# Patient Record
Sex: Male | Born: 1955 | Race: White | Hispanic: No | Smoking: Current every day smoker
Health system: Southern US, Community
[De-identification: ages and names within clinical notes are randomized; demographics above are authoritative.]

## PROBLEM LIST (undated history)

## (undated) DIAGNOSIS — J449 Chronic obstructive pulmonary disease, unspecified: Secondary | ICD-10-CM

## (undated) DIAGNOSIS — I2510 Atherosclerotic heart disease of native coronary artery without angina pectoris: Secondary | ICD-10-CM

## (undated) HISTORY — PX: CORONARY STENT PLACEMENT: SHX1402

---

## 2005-06-08 ENCOUNTER — Emergency Department: Payer: Self-pay | Admitting: Unknown Physician Specialty

## 2015-01-21 ENCOUNTER — Emergency Department: Payer: Self-pay | Admitting: Emergency Medicine

## 2016-02-17 ENCOUNTER — Emergency Department
Admission: EM | Admit: 2016-02-17 | Discharge: 2016-02-17 | Disposition: A | Discharge status: Home / Self Care | Payer: Self-pay | Attending: Emergency Medicine | Admitting: Emergency Medicine

## 2016-02-17 ENCOUNTER — Encounter: Payer: Self-pay | Admitting: Emergency Medicine

## 2016-02-17 ENCOUNTER — Emergency Department: Payer: Self-pay

## 2016-02-17 DIAGNOSIS — H7291 Unspecified perforation of tympanic membrane, right ear: Secondary | ICD-10-CM

## 2016-02-17 DIAGNOSIS — I2510 Atherosclerotic heart disease of native coronary artery without angina pectoris: Secondary | ICD-10-CM | POA: Insufficient documentation

## 2016-02-17 DIAGNOSIS — Z955 Presence of coronary angioplasty implant and graft: Secondary | ICD-10-CM | POA: Insufficient documentation

## 2016-02-17 DIAGNOSIS — H66011 Acute suppurative otitis media with spontaneous rupture of ear drum, right ear: Secondary | ICD-10-CM | POA: Insufficient documentation

## 2016-02-17 DIAGNOSIS — J449 Chronic obstructive pulmonary disease, unspecified: Secondary | ICD-10-CM | POA: Insufficient documentation

## 2016-02-17 DIAGNOSIS — F17200 Nicotine dependence, unspecified, uncomplicated: Secondary | ICD-10-CM | POA: Insufficient documentation

## 2016-02-17 HISTORY — DX: Chronic obstructive pulmonary disease, unspecified: J44.9

## 2016-02-17 HISTORY — DX: Atherosclerotic heart disease of native coronary artery without angina pectoris: I25.10

## 2016-02-17 MED ORDER — OFLOXACIN 0.3 % OP SOLN
5.0000 [drp] | Freq: Every day | OPHTHALMIC | Status: DC
  Administered 2016-02-17: 5 [drp] via OTIC
  Filled 2016-02-17: qty 5

## 2016-02-17 MED ORDER — AMOXICILLIN 500 MG PO CAPS
500.0000 mg | ORAL_CAPSULE | Freq: Once | ORAL | Status: AC
Start: 2016-02-17 — End: 2016-02-17
  Administered 2016-02-17: 500 mg via ORAL
  Filled 2016-02-17: qty 1

## 2016-02-17 MED ORDER — AMOXICILLIN 500 MG PO CAPS: 500.0000 mg | ORAL_CAPSULE | Freq: Three times a day (TID) | ORAL | Status: DC

## 2016-02-17 MED ORDER — ONDANSETRON 8 MG PO TBDP
ORAL_TABLET | ORAL | Status: AC
  Administered 2016-02-17: 10:00:00
  Filled 2016-02-17: qty 1

## 2016-02-17 MED ORDER — OFLOXACIN 0.3 % OP SOLN: 10.0000 [drp] | Freq: Two times a day (BID) | OPHTHALMIC | Status: DC

## 2016-02-17 NOTE — Discharge Instructions (Signed)
You were evaluated for ear infection and found to have perforated ear drum with ear infection.  You are being treated with antibiotic pills called amoxicillin and ear drops called ofloxacin.  Return to the emergency department for any worsening condition including fever, facial pain, confusion altered mental status, headache, or any other symptoms concerning to.  You do need to follow-up with a primary care physician to establish a regular doctor for general health maintenance. The [facility] office numbers provided. He should also follow-up with the ears nose throat specialist, to check on how you're ears healing after your antibiotics.  Dr. Ruben ImJuengal's office number is provided.   Eardrum Perforation The eardrum is a thin, round tissue inside the ear. It allows you to hear. The eardrum can get torn (perforated). Eardrums often heal on their own. There is often little or no long-term hearing loss. HOME CARE  Keep your ear dry while it heals. Do not let your head go under water. Do not swim or dive until your doctor says it is okay.  Before you take a bath or shower, do one of these things to keep water out of your ear:  Put a waterproof earplug in your ear.  Put petroleum jelly all over a cotton ball. Put the cotton ball in your ear.  Take medicines only as told by your doctor.  Avoid blowing your nose if you can. If you blow your nose, do it gently.  Continue your normal activities after your eardrum heals. Your doctor will tell you when your eardrum has healed.  Talk to your doctor before you fly on an airplane.  Keep all doctor follow-up visits as told by your doctor. This is important. GET HELP IF:  You have a fever. GET HELP RIGHT AWAY IF:  You have blood or yellowish-white fluid (pus) coming from your ear.  You feel dizzy or off balance.  You feel sick to your stomach (nauseous), or you throw up (vomit).  You have more pain.   This information is not intended to  replace advice given to you by your health care provider. Make sure you discuss any questions you have with your health care provider.   Document Released: 04/08/2010 Document Revised: 11/09/2014 Document Reviewed: 05/28/2014 Elsevier Interactive Patient Education 2016 Else[employer] Inc.  Otitis Media With Effusion Otitis media with effusion is the presence of fluid in the middle ear. This is a common problem in children, which often follows ear infections. It may be present for weeks or longer after the infection. Unlike an acute ear infection, otitis media with effusion refers only to fluid behind the ear drum and not infection. Children with repeated ear and sinus infections and allergy problems are the most likely to get otitis media with effusion. CAUSES  The most frequent cause of the fluid buildup is dysfunction of the eustachian tubes. These are the tubes that drain fluid in the ears to the back of the nose (nasopharynx). SYMPTOMS   The main symptom of this condition is hearing loss. As a result, you or your child may:  Listen to the TV at a loud volume.  Not respond to questions.  Ask "what" often when spoken to.  Mistake or confuse one sound or word for another.  There may be a sensation of fullness or pressure but usually not pain. DIAGNOSIS   Your health care provider will diagnose this condition by examining you or your child's ears.  Your health care provider may test the pressure in you  or your child's ear with a tympanometer.  A hearing test may be conducted if the problem persists. TREATMENT   Treatment depends on the duration and the effects of the effusion.  Antibiotics, decongestants, nose drops, and cortisone-type drugs (tablets or nasal spray) may not be helpful.  Children with persistent ear effusions may have delayed language or behavioral problems. Children at risk for developmental delays in hearing, learning, and speech may require referral to a specialist  earlier than children not at risk.  You or your child's health care provider may suggest a referral to an ear, nose, and throat surgeon for treatment. The following may help restore normal hearing:  Drainage of fluid.  Placement of ear tubes (tympanostomy tubes).  Removal of adenoids (adenoidectomy). HOME CARE INSTRUCTIONS   Avoid secondhand smoke.  Infants who are breastfed are less likely to have this condition.  Avoid feeding infants while they are lying flat.  Avoid known environmental allergens.  Avoid people who are sick. SEEK MEDICAL CARE IF:   Hearing is not better in 3 months.  Hearing is worse.  Ear pain.  Drainage from the ear.  Dizziness. MAKE SURE YOU:   Understand these instructions.  Will watch your condition.  Will get help right away if you are not doing well or get worse.   This information is not intended to replace advice given to you by your health care provider. Make sure you discuss any questions you have with your health care provider.   Document Released: 11/26/2004 Document Revised: 11/09/2014 Document Reviewed: 05/16/2013 Elsevier Interactive Patient Education [employer]

## 2016-02-17 NOTE — ED Provider Notes (Signed)
[facility] Emergency Department Provider Note   ____________________________________________  Time seen: Approximately 11am I have reviewed the triage vital signs and the triage nursing note.  HISTORY  Chief Complaint Otalgia and Cough   Historian Patient and daughter is in the room  HPI Barry Sampson is a 60 y.o. male who smokes and has COPD and a chronic cough, who does not follow with primary care physician, who is here due to right ear pain for a few days and then this morning his ear started draining. She has a chronic cough, which is really no worse than prior. He has not had a fever. His facial pain or sinus drainage. No headache or confusion altered mental status. Symptoms are moderate. Nothing makes it worse or better    Past Medical History  Diagnosis Date  . COPD (chronic obstructive pulmonary disease) (HCC)   . Coronary artery disease     There are no active problems to display for this patient.   Past Surgical History  Procedure Laterality Date  . Coronary stent placement      Current Outpatient Rx  Name  Route  Sig  Dispense  Refill  . amoxicillin (AMOXIL) 500 MG capsule   Oral   Take 1 capsule (500 mg total) by mouth 3 (three) times daily.   30 capsule   0   . ofloxacin (OCUFLOX) 0.3 % ophthalmic solution   Right Ear   Place 10 drops into the right ear 2 (two) times daily.   14 mL   0     Allergies Review of patient's allergies indicates no known allergies.  No family history on file.  Social History Social History  Substance Use Topics  . Smoking status: Current Every Day Smoker  . Smokeless tobacco: Not on file  . Alcohol Use: No    Review of Systems  Constitutional: Negative for fever. Eyes: Negative for visual changes. ENT: Negative for sore throat. Cardiovascular: Negative for chest pain. Respiratory: Chronic shortness of breath. Gastrointestinal: Negative for abdominal pain, vomiting and  diarrhea. Genitourinary:  Musculoskeletal: Skin: Negative for rash. Neurological: Negative for headache. 10 point Review of Systems otherwise negative ____________________________________________   PHYSICAL EXAM:  VITAL SIGNS: ED Triage Vitals  Enc Vitals Group     BP 02/17/16 0922 155/88 mmHg     Pulse Rate 02/17/16 0922 78     Resp --      Temp 02/17/16 0922 98 F (36.7 C)     Temp Source 02/17/16 0922 Oral     SpO2 02/17/16 0922 95 %     Weight 02/17/16 0922 185 lb (83.915 kg)     Height 02/17/16 0922  (1.702 m)     Head Cir --      Peak Flow --      Pain Score 02/17/16 0925 8     Pain Loc --      Pain Edu? --      Excl. in GC? --      Constitutional: Alert and oriented. Well appearing and in no distress.Somewhat unkept. HEENT   Head: Normocephalic and atraumatic.      Eyes: Conjunctivae are normal. PERRL. Normal extraocular movements.      Ears:   Right TM is dull with fluid behind the eardrum. There is a TM perforation visible.      Nose: No congestion/rhinnorhea.   Mouth/Throat: Mucous membranes are moist. Multiple missing teeth.   Neck: No stridor. Cardiovascular/Chest: Normal rate, regular rhythm.  No murmurs, rubs, or gallops. Respiratory: Normal respiratory effort without tachypnea nor retractions. Breath sounds are clear and equal bilaterally. No wheezes/rales/rhonchi. Gastrointestinal: Nontender Genitourinary/rectal:Deferred Musculoskeletal: Nontender with normal range of motion in all extremities. No joint effusions.  No lower extremity tenderness.  No edema. Neurologic:  Normal speech and language. No gross or focal neurologic deficits are appreciated. Skin:  Skin is warm, dry and intact. No rash noted. Psychiatric: Mood and affect are normal. Speech and behavior are normal. Patient exhibits appropriate insight and judgment.  ____________________________________________   EKG I, Governor Rooksebecca Webber Michiels, MD, the attending physician have personally  viewed and interpreted all ECGs.  None ____________________________________________  LABS (pertinent positives/negatives)  None  ____________________________________________  RADIOLOGY All Xrays were viewed by me. Imaging interpreted by Radiologist.  Chest two-view: No active cardiopulmonary disease. __________________________________________  PROCEDURES  Procedure(s) performed: None  Critical Care performed: None  ____________________________________________   ED COURSE / ASSESSMENT AND PLAN  Pertinent labs & imaging results that were available during my care of the patient were reviewed by me and considered in my medical decision making (see chart for details).   In terms of his right otalgia with drainage, he has evidence of otitis media with perforation. He will be treated with ofloxacin drops and amoxicillin by mouth.  Due to shortage of otic preparation, ophthalmic preparation will be substituted.  No evidence of systemic illness.  Chest x-ray was performed by protocol for complaint of cough, but in discussing with him, it sounds like this is a chronic cough. His chest x-ray is clear.    CONSULTATIONS:   None   Patient / Family / Caregiver informed of clinical course, medical decision-making process, and agree with plan.   I discussed return precautions, follow-up instructions, and discharged instructions with patient and/or family.   ___________________________________________   FINAL CLINICAL IMPRESSION(S) / ED DIAGNOSES   Final diagnoses:  Tympanic membrane perforation, right  Acute suppurative otitis media of right ear with spontaneous rupture of tympanic membrane, recurrence not specified              Note: This dictation was prepared with Dragon dictation. Any transcriptional errors that result from this process are unintentional   Governor Rooksebecca Lavaughn Bisig, MD 02/17/16 1154

## 2016-02-17 NOTE — ED Notes (Signed)
Pt with productive cough, sinus drainage, right ear pain with bloody drainage for 3 days.

## 2016-02-17 NOTE — ED Notes (Signed)
Pt vomited in waiting room.  zofran given.  Skin w/d with good color at this time.

## 2016-02-24 ENCOUNTER — Encounter: Payer: Self-pay | Admitting: Emergency Medicine

## 2016-02-24 ENCOUNTER — Emergency Department
Admission: EM | Admit: 2016-02-24 | Discharge: 2016-02-24 | Disposition: A | Discharge status: Home / Self Care | Payer: Self-pay | Attending: Emergency Medicine | Admitting: Emergency Medicine

## 2016-02-24 ENCOUNTER — Other Ambulatory Visit: Payer: Self-pay | Admitting: Otolaryngology

## 2016-02-24 DIAGNOSIS — F1290 Cannabis use, unspecified, uncomplicated: Secondary | ICD-10-CM | POA: Insufficient documentation

## 2016-02-24 DIAGNOSIS — Z95818 Presence of other cardiac implants and grafts: Secondary | ICD-10-CM | POA: Insufficient documentation

## 2016-02-24 DIAGNOSIS — F17210 Nicotine dependence, cigarettes, uncomplicated: Secondary | ICD-10-CM | POA: Insufficient documentation

## 2016-02-24 DIAGNOSIS — G500 Trigeminal neuralgia: Secondary | ICD-10-CM

## 2016-02-24 DIAGNOSIS — H6691 Otitis media, unspecified, right ear: Secondary | ICD-10-CM | POA: Insufficient documentation

## 2016-02-24 DIAGNOSIS — I2510 Atherosclerotic heart disease of native coronary artery without angina pectoris: Secondary | ICD-10-CM | POA: Insufficient documentation

## 2016-02-24 DIAGNOSIS — J449 Chronic obstructive pulmonary disease, unspecified: Secondary | ICD-10-CM | POA: Insufficient documentation

## 2016-02-24 DIAGNOSIS — Z09 Encounter for follow-up examination after completed treatment for conditions other than malignant neoplasm: Secondary | ICD-10-CM | POA: Insufficient documentation

## 2016-02-24 MED ORDER — HYDROCODONE-ACETAMINOPHEN 5-325 MG PO TABS: 1.0000 | ORAL_TABLET | ORAL | Status: DC | PRN

## 2016-02-24 MED ORDER — OXYCODONE-ACETAMINOPHEN 5-325 MG PO TABS
2.0000 | ORAL_TABLET | Freq: Once | ORAL | Status: AC
  Administered 2016-02-24: 2 via ORAL
  Filled 2016-02-24: qty 2

## 2016-02-24 NOTE — ED Notes (Signed)
Pt c/o right earache since 02/14/16; seen here last Sunday and put on antibiotics and drops; pt says he's almost done with the 10 days of antibiotics and not feeling any better; pt says he feels like his ear is clogged up

## 2016-02-24 NOTE — ED Provider Notes (Addendum)
[facility] Emergency Department Provider Note  ____________________________________________  Time seen: Approximately 1:18 AM  I have reviewed the triage vital signs and the nursing notes.   HISTORY  Chief Complaint Otalgia    HPI Barry Sampson is a 60 y.o. male presents for re-evaluation of right ear pain.  Seen in this ED about 1 week ago, diagnosed with otitis media with perforation in the right ear.  He was given the usual precautions and antibiotic drops as well as oral antibiotics but he states he is feeling no better.  He describes the symptoms as severe, sharp and a feeling of "fullness", and nothing makes it better or worse.  He denies fever/chills, chest pain, shortness of breath, nausea, vomiting, diarrhea, dizziness.  Of note, he states that in an attempt to make it feel better, he has been putting hydrogen peroxide and sweet oil in his ear as well as the antibiotic drops.  Out of desperation he also stuck a vacuum cleaner into his right ear to try to remove "a bad stuff".   Past Medical History  Diagnosis Date  . COPD (chronic obstructive pulmonary disease) (HCC)   . Coronary artery disease     There are no active problems to display for this patient.   Past Surgical History  Procedure Laterality Date  . Coronary stent placement      Current Outpatient Rx  Name  Route  Sig  Dispense  Refill  . amoxicillin (AMOXIL) 500 MG capsule   Oral   Take 1 capsule (500 mg total) by mouth 3 (three) times daily.   30 capsule   0   . HYDROcodone-acetaminophen (NORCO/VICODIN) 5-325 MG tablet   Oral   Take 1-2 tablets by mouth every 4 (four) hours as needed for moderate pain.   10 tablet   0   . ofloxacin (OCUFLOX) 0.3 % ophthalmic solution   Right Ear   Place 10 drops into the right ear 2 (two) times daily.   14 mL   0     Allergies Review of patient's allergies indicates no known allergies.  History reviewed. No pertinent family  history.  Social History Social History  Substance Use Topics  . Smoking status: Current Every Day Smoker -- 1.00 packs/day    Types: Cigarettes  . Smokeless tobacco: None  . Alcohol Use: No    Review of Systems Constitutional: No fever/chills ENT: No sore throat.  Pain in right ear Cardiovascular: Denies chest pain. Respiratory: Denies shortness of breath. Gastrointestinal: No abdominal pain.  No nausea, no vomiting.  No diarrhea.  No constipation. Genitourinary: Negative for dysuria. Musculoskeletal: Negative for back pain. Skin: Negative for rash. Neurological: Negative for headaches, focal weakness or numbness.  10-point ROS otherwise negative.  ____________________________________________   PHYSICAL EXAM:  VITAL SIGNS: ED Triage Vitals  Enc Vitals Group     BP 02/24/16 0028 164/97 mmHg     Pulse Rate 02/24/16 0028 85     Resp 02/24/16 0028 20     Temp 02/24/16 0028 98.2 F (36.8 C)     Temp Source 02/24/16 0028 Oral     SpO2 02/24/16 0028 96 %     Weight 02/24/16 0028 185 lb (83.915 kg)     Height 02/24/16 0028 5\' 7"  (1.702 m)     Head Cir --      Peak Flow --      Pain Score 02/24/16 0036 5     Pain Loc --  Pain Edu? --      Excl. in GC? --     Constitutional: Alert and oriented. Disheveled at baseline, appears uncomfortable Eyes: Conjunctivae are normal. PERRL. EOMI. Head: Atraumatic. Ears:  Healthy appearing left ear canals and TM.  Right ear canal is erythematous and inflamed with an abnormal-appearing tympanic membrane.  I do not specifically see a perforation at this time but it is inflamed, white, and peers infected. Nose: No congestion/rhinnorhea. Cardiovascular: Normal rate, regular rhythm. Good peripheral circulation.  Respiratory: Normal respiratory effort.  No retractions.  Gastrointestinal: Soft and nontender. No distention.  Musculoskeletal: No lower extremity tenderness nor edema. No gross deformities of extremities. Neurologic:   Normal speech and language. No gross focal neurologic deficits are appreciated.  Skin:  Skin is warm, dry and intact. No rash noted.  ____________________________________________   LABS (all labs ordered are listed, but only abnormal results are displayed)  Labs Reviewed - No data to display ____________________________________________  EKG  None ____________________________________________  RADIOLOGY   No results found.  ____________________________________________   PROCEDURES  Procedure(s) performed: None  Critical Care performed: No ____________________________________________   INITIAL IMPRESSION / ASSESSMENT AND PLAN / ED COURSE  Pertinent labs & imaging results that were available during my care of the patient were reviewed by me and considered in my medical decision making (see chart for details).  I counseled the patient that he should not under any circumstances put anything into the affected ear except for the antibiotic drops, including water.  I explained that he must see a specialist and that preferably should happen today.  He still has antibiotic pills and drops and I encouraged him to continue using it.  I gave him a dose of Percocet 2 in the emergency department and a prescription for a few Norco tablets.  I sent Dr. Andee Poles with ENT in a personal message as well as a message through [facility] to let him know about the patient and asked for an appointment in the ENT clinic later today.  The patient knows to call first thing in the morning and how important it is to follow-up.  He understands and agrees with this plan and the patient's wife said she will make sure he follows up and stops putting things in his ear.  ____________________________________________  FINAL CLINICAL IMPRESSION(S) / ED DIAGNOSES  Final diagnoses:  Otitis media of right ear follow-up, not resolved      NEW MEDICATIONS STARTED DURING THIS VISIT:  New Prescriptions    HYDROCODONE-ACETAMINOPHEN (NORCO/VICODIN) 5-325 MG TABLET    Take 1-2 tablets by mouth every 4 (four) hours as needed for moderate pain.      Note:  This document was prepared using Dragon voice recognition software and may include unintentional dictation errors.   Loleta Rose, MD 02/24/16 7829  Loleta Rose, MD 02/24/16 5621

## 2016-02-24 NOTE — Discharge Instructions (Signed)
Please continue to use your antibiotic drops and oral medication.  DO NOT PUT ANYTHING INTO YOUR EAR EXCEPT FOR THE DROPS, NOT EVEN WATER.  Follow up later today in the ENT clinic.    Otitis Media, Adult Otitis media is redness, soreness, and puffiness (swelling) in the space just behind your eardrum (middle ear). It may be caused by allergies or infection. It often happens along with a cold. HOME CARE  Take your medicine as told. Finish it even if you start to feel better.  Only take over-the-counter or prescription medicines for pain, discomfort, or fever as told by your doctor.  Follow up with your doctor as told. GET HELP IF:  You have otitis media only in one ear, or bleeding from your nose, or both.  You notice a lump on your neck.  You are not getting better in 3-5 days.  You feel worse instead of better. GET HELP RIGHT AWAY IF:   You have pain that is not helped with medicine.  You have puffiness, redness, or pain around your ear.  You get a stiff neck.  You cannot move part of your face (paralysis).  You notice that the bone behind your ear hurts when you touch it. MAKE SURE YOU:   Understand these instructions.  Will watch your condition.  Will get help right away if you are not doing well or get worse.   This information is not intended to replace advice given to you by your health care provider. Make sure you discuss any questions you have with your health care provider.   Document Released: 04/06/2008 Document Revised: 11/09/2014 Document Reviewed: 05/16/2013 Elsevier Interactive Patient Education 2016 Elsevier Inc.  Ear Drops, Adult You need to put eardrops in your ear. HOME CARE   Put drops in your affected ear as told.  After putting in the drops, lie down with the ear you put the drops in facing up. Stay this way for 10 minutes. Use the ear drops as long as your doctor tells you.  Before you get up, put a cotton ball gently in your ear. Do not  push it far in your ear.  Do not wash out your ears unless your doctor says it is okay.  Finish all medicines as told by your doctor. You may be told to keep using the eardrops even if you start to feel better.  See your doctor as told for follow-up visits. GET HELP IF:  You have pain that gets worse.  Any unusual fluid (drainage) is coming from your ear (especially if the fluid stinks).  You have trouble hearing.  You get really dizzy as if the room is spinning and feel sick to your stomach (vertigo).  The outside of your ear becomes red or puffy or both. This may be a sign of an allergic reaction. MAKE SURE YOU:   Understand these instructions.  Will watch your condition.  Will get help right away if you are not doing well or get worse.   This information is not intended to replace advice given to you by your health care provider. Make sure you discuss any questions you have with your health care provider.   Document Released: 04/08/2010 Document Revised: 11/09/2014 Document Reviewed: 05/16/2013 Elsevier Interactive Patient Education [employer]

## 2016-02-24 NOTE — ED Notes (Signed)
Pt understands he needs to call ENT MD's office this am before 8am to schedule his appt today; pt shown number on discharge papers and says he will definitely followup; understands he is to put nothing into his ears except the medication ordered during his last visit;

## 2016-03-11 ENCOUNTER — Ambulatory Visit: Payer: Self-pay

## 2016-03-25 ENCOUNTER — Ambulatory Visit: Payer: Self-pay

## 2016-10-14 IMAGING — CR DG CHEST 2V
1 series · 2 of 2 positions shown · non-contrast
Comparison: None.

CLINICAL DATA: Productive cough, sinus discharge

EXAM:
CHEST  2 VIEW

[Series 1: dg chest 2 view · 0.14mm/px · 2 of 2 slices shown]
[im 1/2]
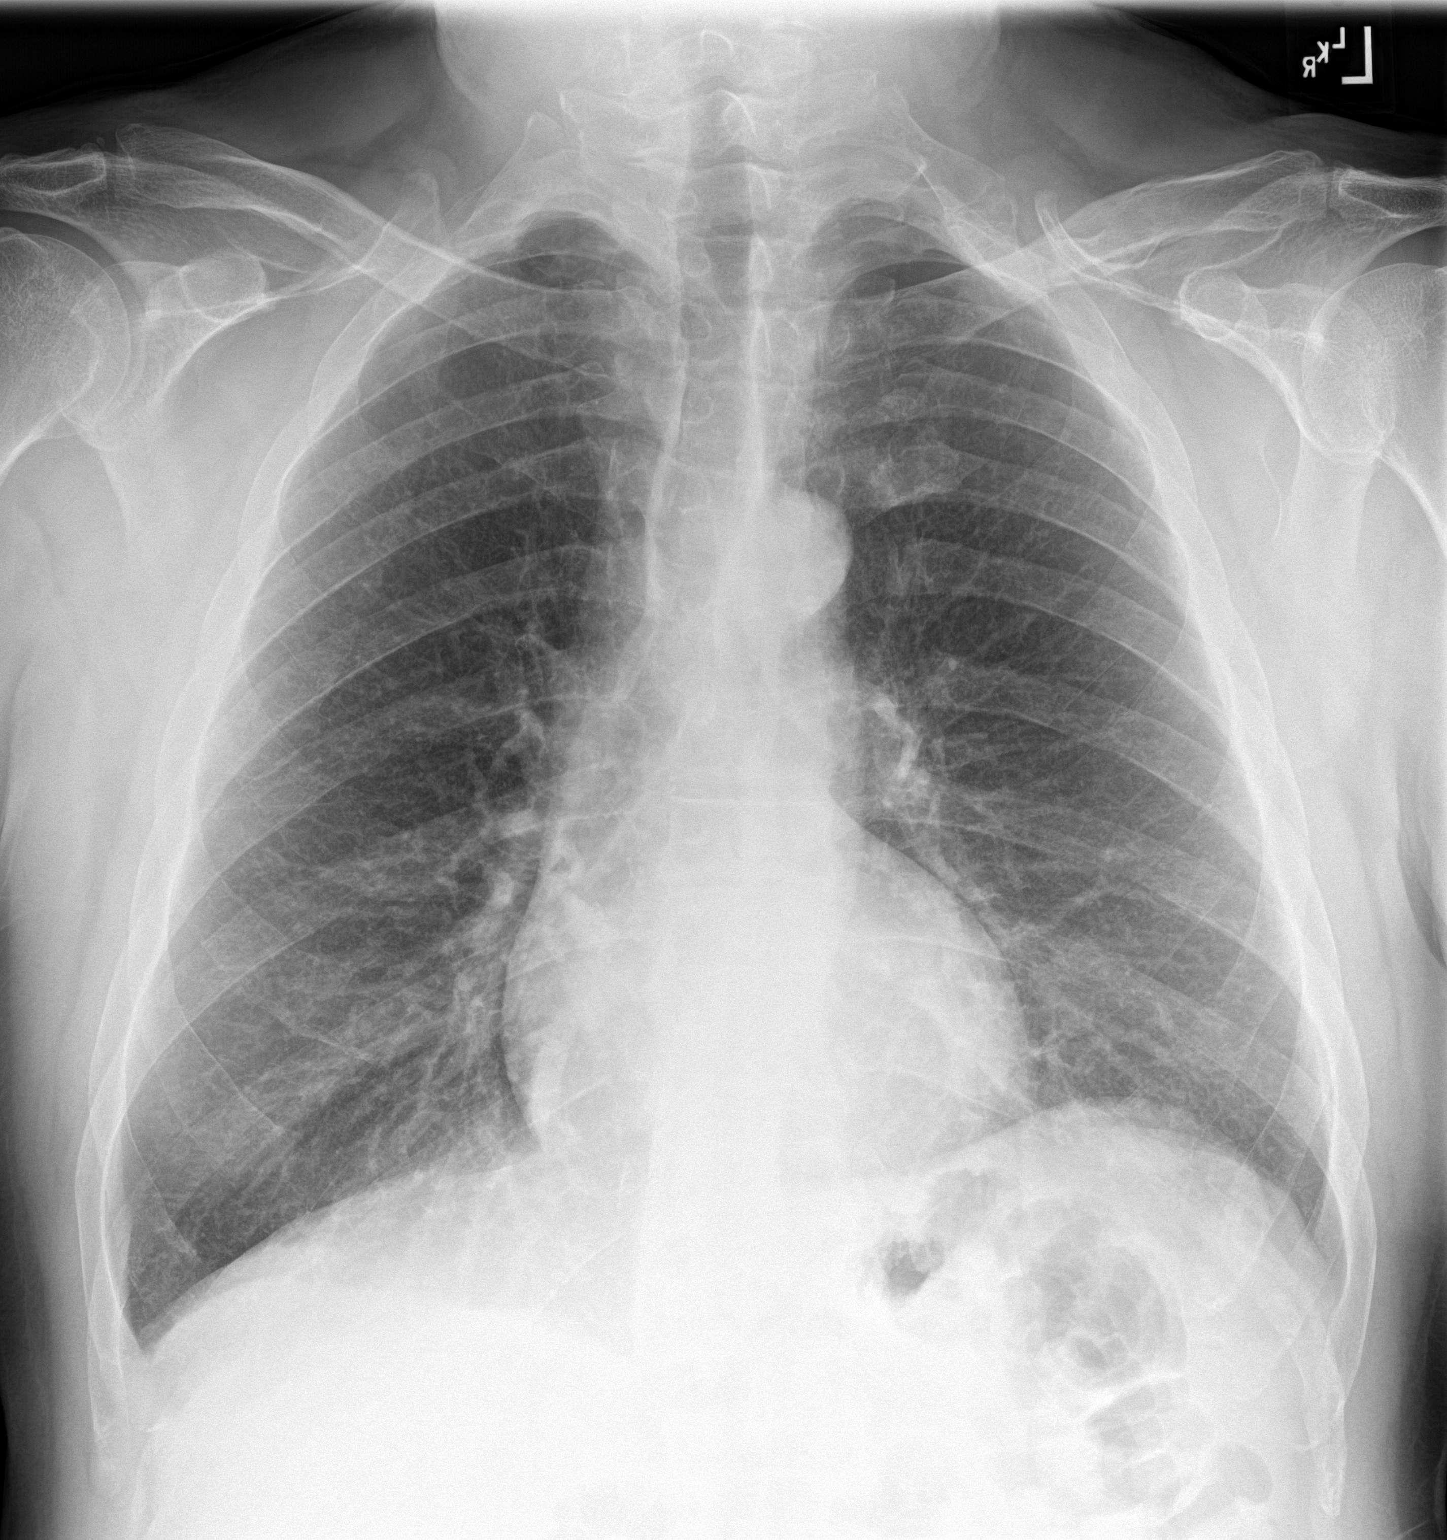
[im 2/2]
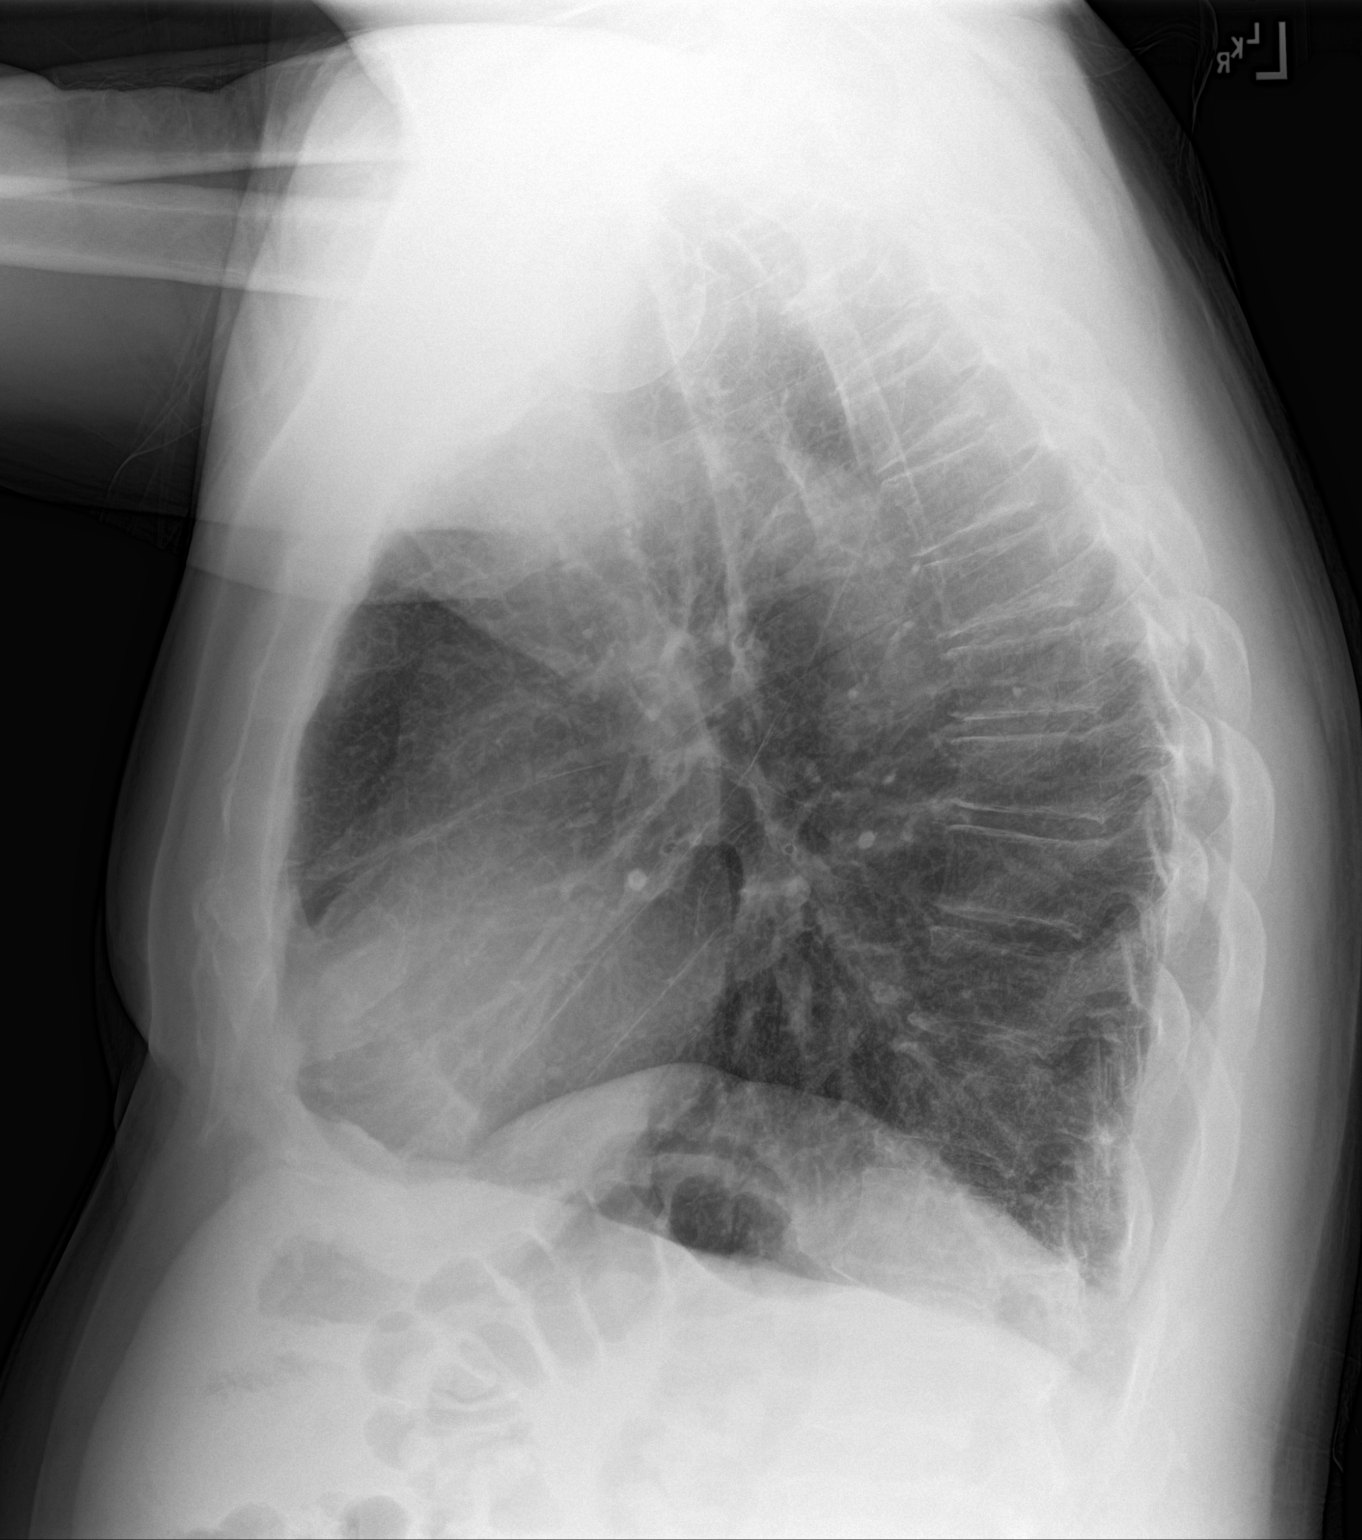

[2 of 2 positions shown; findings below may reference images not displayed]

FINDINGS: The heart size and mediastinal contours are within normal limits.
Both lungs are clear. The visualized skeletal structures are
unremarkable.
IMPRESSION: No active cardiopulmonary disease.

## 2024-04-06 ENCOUNTER — Other Ambulatory Visit: Payer: Self-pay

## 2024-04-06 ENCOUNTER — Emergency Department: Payer: Self-pay

## 2024-04-06 ENCOUNTER — Inpatient Hospital Stay
Admission: EM | Admit: 2024-04-06 | Discharge: 2024-04-15 | DRG: 092 | Disposition: A | Discharge status: Home Health | Payer: Self-pay | Attending: Internal Medicine | Admitting: Internal Medicine

## 2024-04-06 DIAGNOSIS — G9341 Metabolic encephalopathy: Secondary | ICD-10-CM | POA: Diagnosis present

## 2024-04-06 DIAGNOSIS — Z79899 Other long term (current) drug therapy: Secondary | ICD-10-CM

## 2024-04-06 DIAGNOSIS — F17200 Nicotine dependence, unspecified, uncomplicated: Secondary | ICD-10-CM | POA: Insufficient documentation

## 2024-04-06 DIAGNOSIS — R4182 Altered mental status, unspecified: Principal | ICD-10-CM

## 2024-04-06 DIAGNOSIS — F17210 Nicotine dependence, cigarettes, uncomplicated: Secondary | ICD-10-CM | POA: Diagnosis present

## 2024-04-06 DIAGNOSIS — J449 Chronic obstructive pulmonary disease, unspecified: Secondary | ICD-10-CM | POA: Diagnosis present

## 2024-04-06 DIAGNOSIS — Z8673 Personal history of transient ischemic attack (TIA), and cerebral infarction without residual deficits: Secondary | ICD-10-CM

## 2024-04-06 DIAGNOSIS — Z8679 Personal history of other diseases of the circulatory system: Secondary | ICD-10-CM

## 2024-04-06 DIAGNOSIS — F1410 Cocaine abuse, uncomplicated: Secondary | ICD-10-CM | POA: Diagnosis present

## 2024-04-06 DIAGNOSIS — W19XXXA Unspecified fall, initial encounter: Secondary | ICD-10-CM | POA: Diagnosis present

## 2024-04-06 DIAGNOSIS — F1490 Cocaine use, unspecified, uncomplicated: Secondary | ICD-10-CM

## 2024-04-06 DIAGNOSIS — I5022 Chronic systolic (congestive) heart failure: Secondary | ICD-10-CM | POA: Diagnosis present

## 2024-04-06 DIAGNOSIS — F1210 Cannabis abuse, uncomplicated: Secondary | ICD-10-CM | POA: Diagnosis present

## 2024-04-06 DIAGNOSIS — I447 Left bundle-branch block, unspecified: Secondary | ICD-10-CM | POA: Diagnosis present

## 2024-04-06 DIAGNOSIS — G928 Other toxic encephalopathy: Principal | ICD-10-CM | POA: Diagnosis present

## 2024-04-06 DIAGNOSIS — I2510 Atherosclerotic heart disease of native coronary artery without angina pectoris: Secondary | ICD-10-CM | POA: Diagnosis present

## 2024-04-06 DIAGNOSIS — E785 Hyperlipidemia, unspecified: Secondary | ICD-10-CM | POA: Diagnosis present

## 2024-04-06 DIAGNOSIS — R0902 Hypoxemia: Secondary | ICD-10-CM | POA: Diagnosis present

## 2024-04-06 DIAGNOSIS — I110 Hypertensive heart disease with heart failure: Secondary | ICD-10-CM | POA: Diagnosis present

## 2024-04-06 DIAGNOSIS — I255 Ischemic cardiomyopathy: Secondary | ICD-10-CM | POA: Diagnosis present

## 2024-04-06 DIAGNOSIS — F1290 Cannabis use, unspecified, uncomplicated: Secondary | ICD-10-CM

## 2024-04-06 DIAGNOSIS — I10 Essential (primary) hypertension: Secondary | ICD-10-CM | POA: Insufficient documentation

## 2024-04-06 DIAGNOSIS — G319 Degenerative disease of nervous system, unspecified: Secondary | ICD-10-CM | POA: Diagnosis present

## 2024-04-06 DIAGNOSIS — Z955 Presence of coronary angioplasty implant and graft: Secondary | ICD-10-CM

## 2024-04-06 LAB — URINE DRUG SCREEN, QUALITATIVE (ARMC ONLY)
Amphetamines, Ur Screen: NOT DETECTED
Barbiturates, Ur Screen: NOT DETECTED
Benzodiazepine, Ur Scrn: NOT DETECTED
Cannabinoid 50 Ng, Ur ~~LOC~~: POSITIVE — AB
Cocaine Metabolite,Ur ~~LOC~~: POSITIVE — AB
MDMA (Ecstasy)Ur Screen: NOT DETECTED
Methadone Scn, Ur: NOT DETECTED
Opiate, Ur Screen: NOT DETECTED
Phencyclidine (PCP) Ur S: NOT DETECTED
Tricyclic, Ur Screen: NOT DETECTED

## 2024-04-06 LAB — CBC WITH DIFFERENTIAL/PLATELET
Abs Immature Granulocytes: 0.03 10*3/uL (ref 0.00–0.07)
Basophils Absolute: 0.1 10*3/uL (ref 0.0–0.1)
Basophils Relative: 1 %
Eosinophils Absolute: 0.2 10*3/uL (ref 0.0–0.5)
Eosinophils Relative: 3 %
HCT: 44.3 % (ref 39.0–52.0)
Hemoglobin: 14.9 g/dL (ref 13.0–17.0)
Immature Granulocytes: 0 %
Lymphocytes Relative: 40 %
Lymphs Abs: 3 10*3/uL (ref 0.7–4.0)
MCH: 32.4 pg (ref 26.0–34.0)
MCHC: 33.6 g/dL (ref 30.0–36.0)
MCV: 96.3 fL (ref 80.0–100.0)
Monocytes Absolute: 0.6 10*3/uL (ref 0.1–1.0)
Monocytes Relative: 7 %
Neutro Abs: 3.7 10*3/uL (ref 1.7–7.7)
Neutrophils Relative %: 49 %
Platelets: 188 10*3/uL (ref 150–400)
RBC: 4.6 MIL/uL (ref 4.22–5.81)
RDW: 13.2 % (ref 11.5–15.5)
WBC: 7.5 10*3/uL (ref 4.0–10.5)
nRBC: 0 % (ref 0.0–0.2)

## 2024-04-06 LAB — URINALYSIS, W/ REFLEX TO CULTURE (INFECTION SUSPECTED)
Bilirubin Urine: NEGATIVE
Glucose, UA: NEGATIVE mg/dL
Hgb urine dipstick: NEGATIVE
Ketones, ur: NEGATIVE mg/dL
Leukocytes,Ua: NEGATIVE
Nitrite: NEGATIVE
Protein, ur: NEGATIVE mg/dL
Specific Gravity, Urine: 1.026 (ref 1.005–1.030)
Squamous Epithelial / HPF: 0 /HPF (ref 0–5)
pH: 6 (ref 5.0–8.0)

## 2024-04-06 LAB — BASIC METABOLIC PANEL WITH GFR
Anion gap: 9 (ref 5–15)
BUN: 14 mg/dL (ref 8–23)
CO2: 25 mmol/L (ref 22–32)
Calcium: 9 mg/dL (ref 8.9–10.3)
Chloride: 104 mmol/L (ref 98–111)
Creatinine, Ser: 1.07 mg/dL (ref 0.61–1.24)
GFR, Estimated: >60 mL/min (ref >60)
Glucose, Bld: 100 mg/dL — ABNORMAL HIGH (ref 70–99)
Potassium: 3.8 mmol/L (ref 3.5–5.1)
Sodium: 138 mmol/L (ref 135–145)

## 2024-04-06 LAB — TROPONIN I (HIGH SENSITIVITY): Troponin I (High Sensitivity): 14 ng/L

## 2024-04-06 LAB — HEPATIC FUNCTION PANEL
ALT: 36 U/L (ref 0–44)
AST: 35 U/L (ref 15–41)
Albumin: 4 g/dL (ref 3.5–5.0)
Alkaline Phosphatase: 41 U/L (ref 38–126)
Bilirubin, Direct: <0.1 mg/dL (ref 0.0–0.2)
Total Bilirubin: 0.8 mg/dL (ref 0.0–1.2)
Total Protein: 7.9 g/dL (ref 6.5–8.1)

## 2024-04-06 LAB — AMMONIA: Ammonia: 20 umol/L (ref 9–35)

## 2024-04-06 LAB — ETHANOL: Alcohol, Ethyl (B): <15 mg/dL (ref <15)

## 2024-04-06 NOTE — ED Triage Notes (Addendum)
 Pt to ED via [place] [facility] [place], pt found wandering around after driving for the past 4 hours. Pt able to give name and that he is at the ER. Pt reports some dysuria, denies falling and hitting head that he can remember. Speech clear pt moving all extremities freely. Pupils WNL. Pt reports hx stroke, daughter states pt has been missing since yesterday

## 2024-04-06 NOTE — ED Provider Notes (Addendum)
 Mardene Shake Provider Note    Event Date/Time   First MD Initiated Contact with Patient 04/06/24 1930     (approximate)   History   Altered Mental Status   HPI  Barry Sampson is a 68 y.o. male with history of CAD, COPD, presenting with altered mental status.  Per EMS patient was found at a local gas station, was wandering around for the past 4 hours, was able to give him his name but not able to give any other information.  He denies any falls or trauma, did report some dysuria.  Per children, patient does not have any history of dementia, had prior history of stroke without known residual deficits, not on any blood thinners.  No history of alcohol or drug use, no history of liver issues.  States that patient lives at home and takes care of his ADLs.  States that they last saw him yesterday.  Patient has no complaints at this time.  No prior history of psychiatric issues.  Independent history obtained from EMS as well as patient's children above.  On independent chart review of meds, patient had Vicodin listed on his med list but children states that they do not think that he is taking the Vicodin anymore.     Physical Exam   Triage Vital Signs: ED Triage Vitals  Encounter Vitals Group     BP 04/06/24 1920 (!) 166/92     Systolic BP Percentile --      Diastolic BP Percentile --      Pulse Rate 04/06/24 1920 86     Resp 04/06/24 1920 18     Temp 04/06/24 1920 98.4 F (36.9 C)     Temp Source 04/06/24 1920 Oral     SpO2 04/06/24 1912 96 %     Weight 04/06/24 1921 195 lb (88.5 kg)     Height 04/06/24 1921 5\' 10"  (1.778 m)     Head Circumference --      Peak Flow --      Pain Score 04/06/24 1926 0     Pain Loc --      Pain Education --      Exclude from Growth Chart --     Most recent vital signs: Vitals:   04/06/24 1920 04/06/24 2000  BP: (!) 166/92 (!) 164/93  Pulse: 86 79  Resp: 18 18  Temp: 98.4 F (36.9 C)   SpO2: 98% 97%      General: Awake, no distress.  CV:  Good peripheral perfusion.  Resp:  Normal effort.  No thoracic cage tenderness, no respiratory distress or tachypnea Abd:  No distention.  Soft nontender Other:  Pulls are equal and reactive, extraocular movements are intact, no palpable skull deformities or tenderness, no facial deformities or tenderness, no midline spinal tenderness, no CVA tenderness, no tenderness to his extremities, range of motion is intact, no focal weakness or numbness.  Patient is alert to place, is able to state his name but not able to give his full name, also does not know what year it is or give any additional information about where he was or why he was there.  He does not appear to be responding to internal stimuli.   ED Results / Procedures / Treatments   Labs (all labs ordered are listed, but only abnormal results are displayed) Labs Reviewed  BASIC METABOLIC PANEL WITH GFR - Abnormal; Notable for the following components:      Result Value  Glucose, Bld 100 (*)    All other components within normal limits  URINALYSIS, W/ REFLEX TO CULTURE (INFECTION SUSPECTED) - Abnormal; Notable for the following components:   Color, Urine YELLOW (*)    APPearance CLEAR (*)    Bacteria, UA RARE (*)    All other components within normal limits  URINE DRUG SCREEN, QUALITATIVE ([facility] ONLY) - Abnormal; Notable for the following components:   Cocaine Metabolite,Ur [place] POSITIVE (*)    Cannabinoid 50 Ng, Ur [place] POSITIVE (*)    All other components within normal limits  CBC WITH DIFFERENTIAL/PLATELET  ETHANOL  HEPATIC FUNCTION PANEL  AMMONIA  TROPONIN I (HIGH SENSITIVITY)  TROPONIN I (HIGH SENSITIVITY)     EKG  EKG shows, sinus rhythm, 78, widened QRS, prolonged QTc, left bundle branch block, T wave inversion to inferior lateral leads, does not meet Sgarbossa's criteria, no prior to compare   RADIOLOGY On my independent interpretation, CT head without obvious intracranial  hemorrhage   PROCEDURES:  Critical Care performed: No  Procedures   MEDICATIONS ORDERED IN ED: Medications - No data to display   IMPRESSION / MDM / ASSESSMENT AND PLAN / ED COURSE  I reviewed the triage vital signs and the nursing notes.                              Differential diagnosis includes, but is not limited to, consider CVA but he has no focal deficits on exam, considered transient global amnesia, toxidrome, alcohol use, substance use, electrolyte derangements, occult infection such as UTI or pneumonia, hyperammonemia, uremia.  Will get labs, EKG, troponin, chest x-ray, CT head, UA.  He will likely need to be admitted for further management.  Patient's presentation is most consistent with acute presentation with potential threat to life or bodily function.  Independent interpretation of labs and imaging below.  Updated family and patient about imaging and lab results.  Family states that patient was covered in ticks previously.  On exam, no ticks noted, no targetoid lesions noted.  Given the altered mental status that appears to be new, will admit for further management, likely needs MRI inpatient.  Consult to hospitalist who is agreeable with plan for admission and will evaluate patient.  He is admitted.  The patient is on the cardiac monitor to evaluate for evidence of arrhythmia and/or significant heart rate changes.   Clinical Course as of 04/06/24 2319  Thu Apr 06, 2024  2026 DG Chest 1 View No active disease.  [TT]  2027 CT Head Wo Contrast IMPRESSION: 1. No acute intracranial abnormality. 2. Generalized cerebral atrophy with widening of the extra-axial spaces and ventricular dilatation.   [TT]  2136 Independent review of labs, ammonia is not elevated, LFTs are normal, UA is not consistent with UTI, troponin is not elevated, no leukocytosis, electrolytes not severely deranged. [TT]  2151 Urine Drug Screen, Qualitative(!) Positive for cocaine and  cannabinoids. [TT]  2158 Ethanol Not elevated [TT]    Clinical Course User Index [TT] Drenda Gentle Richard Champion, MD     FINAL CLINICAL IMPRESSION(S) / ED DIAGNOSES   Final diagnoses:  Altered mental status, unspecified altered mental status type  Cocaine use  Marijuana use     Rx / DC Orders   ED Discharge Orders     None        Note:  This document was prepared using Dragon voice recognition software and may include unintentional dictation errors.  Shane Darling, MD 04/06/24 Lari Pleva    Shane Darling, MD 04/06/24 [id number]

## 2024-04-06 NOTE — ED Triage Notes (Signed)
 EMS [place] pt in [place] gas station where he was found "wandering around" after driving there 4hrs ago; oriented to self only

## 2024-04-07 ENCOUNTER — Observation Stay

## 2024-04-07 ENCOUNTER — Encounter: Payer: Self-pay | Admitting: Internal Medicine

## 2024-04-07 DIAGNOSIS — I255 Ischemic cardiomyopathy: Secondary | ICD-10-CM | POA: Diagnosis present

## 2024-04-07 DIAGNOSIS — E785 Hyperlipidemia, unspecified: Secondary | ICD-10-CM | POA: Diagnosis present

## 2024-04-07 DIAGNOSIS — G928 Other toxic encephalopathy: Secondary | ICD-10-CM | POA: Diagnosis present

## 2024-04-07 DIAGNOSIS — F1410 Cocaine abuse, uncomplicated: Secondary | ICD-10-CM | POA: Insufficient documentation

## 2024-04-07 DIAGNOSIS — I110 Hypertensive heart disease with heart failure: Secondary | ICD-10-CM | POA: Diagnosis present

## 2024-04-07 DIAGNOSIS — Z955 Presence of coronary angioplasty implant and graft: Secondary | ICD-10-CM | POA: Diagnosis not present

## 2024-04-07 DIAGNOSIS — R413 Other amnesia: Secondary | ICD-10-CM | POA: Diagnosis not present

## 2024-04-07 DIAGNOSIS — G319 Degenerative disease of nervous system, unspecified: Secondary | ICD-10-CM | POA: Diagnosis present

## 2024-04-07 DIAGNOSIS — I158 Other secondary hypertension: Secondary | ICD-10-CM | POA: Diagnosis not present

## 2024-04-07 DIAGNOSIS — I2510 Atherosclerotic heart disease of native coronary artery without angina pectoris: Secondary | ICD-10-CM | POA: Diagnosis present

## 2024-04-07 DIAGNOSIS — I10 Essential (primary) hypertension: Secondary | ICD-10-CM | POA: Insufficient documentation

## 2024-04-07 DIAGNOSIS — F1490 Cocaine use, unspecified, uncomplicated: Secondary | ICD-10-CM | POA: Diagnosis present

## 2024-04-07 DIAGNOSIS — I5022 Chronic systolic (congestive) heart failure: Secondary | ICD-10-CM | POA: Diagnosis present

## 2024-04-07 DIAGNOSIS — Z8673 Personal history of transient ischemic attack (TIA), and cerebral infarction without residual deficits: Secondary | ICD-10-CM | POA: Diagnosis not present

## 2024-04-07 DIAGNOSIS — J449 Chronic obstructive pulmonary disease, unspecified: Secondary | ICD-10-CM | POA: Insufficient documentation

## 2024-04-07 DIAGNOSIS — R839 Unspecified abnormal finding in cerebrospinal fluid: Secondary | ICD-10-CM | POA: Diagnosis not present

## 2024-04-07 DIAGNOSIS — G9341 Metabolic encephalopathy: Secondary | ICD-10-CM | POA: Diagnosis not present

## 2024-04-07 DIAGNOSIS — R0902 Hypoxemia: Secondary | ICD-10-CM

## 2024-04-07 DIAGNOSIS — G0490 Encephalitis and encephalomyelitis, unspecified: Secondary | ICD-10-CM | POA: Diagnosis not present

## 2024-04-07 DIAGNOSIS — I447 Left bundle-branch block, unspecified: Secondary | ICD-10-CM | POA: Diagnosis present

## 2024-04-07 DIAGNOSIS — R4182 Altered mental status, unspecified: Secondary | ICD-10-CM | POA: Diagnosis not present

## 2024-04-07 DIAGNOSIS — Z8679 Personal history of other diseases of the circulatory system: Secondary | ICD-10-CM

## 2024-04-07 DIAGNOSIS — R9089 Other abnormal findings on diagnostic imaging of central nervous system: Secondary | ICD-10-CM | POA: Diagnosis not present

## 2024-04-07 DIAGNOSIS — W19XXXA Unspecified fall, initial encounter: Secondary | ICD-10-CM | POA: Diagnosis present

## 2024-04-07 DIAGNOSIS — F17210 Nicotine dependence, cigarettes, uncomplicated: Secondary | ICD-10-CM | POA: Diagnosis present

## 2024-04-07 DIAGNOSIS — F1210 Cannabis abuse, uncomplicated: Secondary | ICD-10-CM | POA: Diagnosis present

## 2024-04-07 DIAGNOSIS — R569 Unspecified convulsions: Secondary | ICD-10-CM | POA: Diagnosis not present

## 2024-04-07 DIAGNOSIS — F17200 Nicotine dependence, unspecified, uncomplicated: Secondary | ICD-10-CM | POA: Insufficient documentation

## 2024-04-07 DIAGNOSIS — Z79899 Other long term (current) drug therapy: Secondary | ICD-10-CM | POA: Diagnosis not present

## 2024-04-07 LAB — CK: Total CK: 25 U/L — ABNORMAL LOW (ref 49–397)

## 2024-04-07 LAB — LIPID PANEL
Cholesterol: 221 mg/dL — ABNORMAL HIGH (ref 0–200)
HDL: 25 mg/dL — ABNORMAL LOW (ref >40)
LDL Cholesterol: 168 mg/dL — ABNORMAL HIGH (ref 0–99)
Total CHOL/HDL Ratio: 8.8 ratio
Triglycerides: 140 mg/dL (ref <150)
VLDL: 28 mg/dL (ref 0–40)

## 2024-04-07 LAB — BASIC METABOLIC PANEL WITH GFR
Anion gap: 8 (ref 5–15)
BUN: 14 mg/dL (ref 8–23)
CO2: 23 mmol/L (ref 22–32)
Calcium: 8.7 mg/dL — ABNORMAL LOW (ref 8.9–10.3)
Chloride: 106 mmol/L (ref 98–111)
Creatinine, Ser: 0.81 mg/dL (ref 0.61–1.24)
GFR, Estimated: >60 mL/min (ref >60)
Glucose, Bld: 102 mg/dL — ABNORMAL HIGH (ref 70–99)
Potassium: 4 mmol/L (ref 3.5–5.1)
Sodium: 137 mmol/L (ref 135–145)

## 2024-04-07 LAB — CBC
HCT: 44 % (ref 39.0–52.0)
Hemoglobin: 14.8 g/dL (ref 13.0–17.0)
MCH: 31.8 pg (ref 26.0–34.0)
MCHC: 33.6 g/dL (ref 30.0–36.0)
MCV: 94.4 fL (ref 80.0–100.0)
Platelets: 165 10*3/uL (ref 150–400)
RBC: 4.66 MIL/uL (ref 4.22–5.81)
RDW: 12.8 % (ref 11.5–15.5)
WBC: 7.8 10*3/uL (ref 4.0–10.5)
nRBC: 0 % (ref 0.0–0.2)

## 2024-04-07 LAB — HEMOGLOBIN A1C
Hgb A1c MFr Bld: 5.7 % — ABNORMAL HIGH (ref 4.8–5.6)
Mean Plasma Glucose: 116.89 mg/dL

## 2024-04-07 LAB — TROPONIN I (HIGH SENSITIVITY): Troponin I (High Sensitivity): 16 ng/L (ref <18)

## 2024-04-07 LAB — MAGNESIUM: Magnesium: 2.2 mg/dL (ref 1.7–2.4)

## 2024-04-07 LAB — D-DIMER, QUANTITATIVE: D-Dimer, Quant: 0.74 ug{FEU}/mL — ABNORMAL HIGH (ref 0.00–0.50)

## 2024-04-07 LAB — PHOSPHORUS: Phosphorus: 2.8 mg/dL (ref 2.5–4.6)

## 2024-04-07 LAB — HIV ANTIBODY (ROUTINE TESTING W REFLEX): HIV Screen 4th Generation wRfx: NONREACTIVE

## 2024-04-07 MED ORDER — ACETAMINOPHEN 650 MG RE SUPP: 650.0000 mg | RECTAL | Status: DC | PRN

## 2024-04-07 MED ORDER — ACETAMINOPHEN 325 MG PO TABS
650.0000 mg | ORAL_TABLET | ORAL | Status: DC | PRN
  Administered 2024-04-11: 650 mg via ORAL
  Filled 2024-04-07: qty 2

## 2024-04-07 MED ORDER — STROKE: EARLY STAGES OF RECOVERY BOOK: Freq: Once | Status: AC

## 2024-04-07 MED ORDER — ACETAMINOPHEN 160 MG/5ML PO SOLN: 650.0000 mg | ORAL | Status: DC | PRN

## 2024-04-07 MED ORDER — ENOXAPARIN SODIUM 40 MG/0.4ML IJ SOSY
40.0000 mg | PREFILLED_SYRINGE | INTRAMUSCULAR | Status: DC
Start: 2024-04-07 — End: 2024-04-10
  Administered 2024-04-07 – 2024-04-08 (×2): 40 mg via SUBCUTANEOUS
  Filled 2024-04-07 (×3): qty 0.4

## 2024-04-07 MED ORDER — ATORVASTATIN CALCIUM 20 MG PO TABS
40.0000 mg | ORAL_TABLET | Freq: Every day | ORAL | Status: DC
  Administered 2024-04-07 – 2024-04-15 (×9): 40 mg via ORAL
  Filled 2024-04-07 (×9): qty 2

## 2024-04-07 NOTE — Assessment & Plan Note (Signed)
 History of systolic CHF (EF 25 to 30% 2015) LBBB, known No chest pain, shortness of breath EKG is nonacute Patient is currently on no meds

## 2024-04-07 NOTE — Evaluation (Signed)
 Physical Therapy Evaluation Patient Details Name: Barry Sampson MRN: [id number] DOB: [birth year] Today's Date: 04/07/2024  History of Present Illness  Pt is a 68 y.o. male brought in by EMS after he was found wandering. Last hospitalized in 2015 for COPD exacerbation and has no regular healthcare. Daughter states that up until a couple weeks ago he was his normal self with no history of memory deficit, however over the past 2 weeks they noticed that he was forgetting stuff and on 1 occasion he found his glasses in the freezer. Fall ~4 days ago, presently oriented to self only. MRI negative. PMH of CAD s/p PCI, LBBB, systolic CHF (EF 78-29%), HTN, COPD, tobacco abuse, past history of cocaine abuse.   Clinical Impression  Patient alert, agreeable to mobility. Oriented to self and place, but unable to provide clear PLOF. He was able to perform bed mobilty modI, and ambulate ~124ft with and without RW, no true difference noted with use of RW. Pt returned to room with needs in reach. Pt would benefit from further skilled PT intervention to ensure return to PLOF.         If plan is discharge home, recommend the following: Assist for transportation;Help with stairs or ramp for entrance   Can travel by private vehicle        Equipment Recommendations None recommended by PT  Recommendations for Other Services       Functional Status Assessment       Precautions / Restrictions Precautions Precautions: Fall Recall of Precautions/Restrictions: Impaired Restrictions Weight Bearing Restrictions Per Provider Order: No      Mobility  Bed Mobility Overal bed mobility: Modified Independent                  Transfers Overall transfer level: Modified independent                      Ambulation/Gait Ambulation/Gait assistance: Supervision, Contact guard assist Gait Distance (Feet): 170 Feet Assistive device: Rolling walker (2 wheels), None         General Gait Details:  pt able to ambulate with and without RW, no true benefit of RW vs not  Stairs            Wheelchair Mobility     Tilt Bed    Modified Rankin (Stroke Patients Only)       Balance Overall balance assessment: Needs assistance Sitting-balance support: Feet supported Sitting balance-Leahy Scale: Normal       Standing balance-Leahy Scale: Good                               Pertinent Vitals/Pain Pain Assessment Pain Assessment: No/denies pain    Home Living                          Prior Function                       Extremity/Trunk Assessment   Upper Extremity Assessment Upper Extremity Assessment: Overall WFL for tasks assessed    Lower Extremity Assessment Lower Extremity Assessment: Overall WFL for tasks assessed       Communication        Cognition Arousal: Alert Behavior During Therapy: [facility] for tasks assessed/performed  PT - Cognition Comments: oriented to self and place Following commands: Impaired Following commands impaired: Only follows one step commands consistently     Cueing Cueing Techniques: Verbal cues     General Comments      Exercises     Assessment/Plan    PT Assessment    PT Problem List         PT Treatment Interventions      PT Goals (Current goals can be found in the Care Plan section)       Frequency Min 2X/week     Co-evaluation               AM-PAC PT "6 Clicks" Mobility  Outcome Measure Help needed turning from your back to your side while in a flat bed without using bedrails?: A Little Help needed moving from lying on your back to sitting on the side of a flat bed without using bedrails?: A Little Help needed moving to and from a bed to a chair (including a wheelchair)?: A Little Help needed standing up from a chair using your arms (e.g., wheelchair or bedside chair)?: A Little Help needed to walk in hospital room?: A  Little Help needed climbing 3-5 steps with a railing? : A Little 6 Click Score: 18    End of Session Equipment Utilized During Treatment: Gait belt Activity Tolerance: Patient tolerated treatment well Patient left: in bed;with call bell/phone within reach;with bed alarm set Nurse Communication: Mobility status PT Visit Diagnosis: Unsteadiness on feet (R26.81);Difficulty in walking, not elsewhere classified (R26.2);Muscle weakness (generalized) (M62.81)    Time: 0981-1914 PT Time Calculation (min) (ACUTE ONLY): 8 min   Charges:   PT Evaluation $PT Eval Low Complexity: 1 Low   PT General Charges $$ ACUTE PT VISIT: 1 Visit         Darien Eden PT, DPT 3:57 PM,04/07/24

## 2024-04-07 NOTE — Assessment & Plan Note (Addendum)
(  Acute respiratory failure with hypoxia) Patient became hypoxic to 85% on room air following admission, placed on O2 Will get D-dimer with further workup with elevated Chest x-ray was nonacute Continue nasal cannula

## 2024-04-07 NOTE — Assessment & Plan Note (Signed)
 Holding home meds during stroke workup

## 2024-04-07 NOTE — Evaluation (Signed)
 Occupational Therapy Evaluation Patient Details Name: Barry Sampson MRN: [id number] DOB: 12/05/1955 Today's Date: 04/07/2024   History of Present Illness   Pt is a 68 y.o. male brought in by EMS after he was found wandering. Last hospitalized in 2015 for COPD exacerbation and has no regular healthcare. Daughter states that up until a couple weeks ago he was his normal self with no history of memory deficit, however over the past 2 weeks they noticed that he was forgetting stuff and on 1 occasion he found his glasses in the freezer. Fall ~4 days ago, presently oriented to self only. MRI negative. PMH of CAD s/p PCI, LBBB, systolic CHF (EF 13-08%), HTN, COPD, tobacco abuse, past history of cocaine abuse.     Clinical Impressions Pt was seen for OT evaluation this date. PTA, pt reports he lives alone with a one level home with 4-5 STE with R sided HR. He is typically independent with all tasks and does not utilize any DME. 1 recent fall a few days ago. Supportive family nearby.   Pt presents to acute OT demonstrating impaired ADL performance and functional mobility 2/2 deficits in cognition, safety awareness and mild balance deficits. Pt currently requires MOD I for bed mobility and supervision for STS from EOB. MOD I/SUP for LB dressing with frequent cueing for initiation and attention to task. Ambulated ~175 feet without AD with CGA to SBA with occasional R sided sway and cues to maintain attention and relocate his room/family. Per family, gait appears slower than usual, but they report mild sway/imbalance at baseline. Pt's most limited factor is his cognition limiting his safety to return home alone. Will follow acutely to ensure safe DC to maximize return to independence while minimizing falls risk and caregiver burden. Recommend supervision from family for safest discharge at this time d/t cognition, unsure that HHOT is needed.     If plan is discharge home, recommend the following:   A  little help with walking and/or transfers;Supervision due to cognitive status;A little help with bathing/dressing/bathroom     Functional Status Assessment   Patient has had a recent decline in their functional status and demonstrates the ability to make significant improvements in function in a reasonable and predictable amount of time.     Equipment Recommendations   None recommended by OT     Recommendations for Other Services         Precautions/Restrictions   Precautions Precautions: Fall Recall of Precautions/Restrictions: Impaired Restrictions Weight Bearing Restrictions Per Provider Order: No     Mobility Bed Mobility Overal bed mobility: Modified Independent                  Transfers Overall transfer level: Modified independent                 General transfer comment: supervision for STS from EOB, no AD used for ~175 feet ambulation around nurses station with mild R sway at times and cueing for turns/directions and finding his room; CGA to SBA provided d/t cognition      Balance Overall balance assessment: Mild deficits observed, not formally tested, History of Falls                                         ADL either performed or assessed with clinical judgement   ADL Overall ADL's : Needs assistance/impaired  Lower Body Dressing: Supervision/safety;Sitting/lateral leans;Sit to/from stand;Cueing for sequencing Lower Body Dressing Details (indicate cue type and reason): to don bil socks and pants with cueing for initiation and maintaining attention             Functional mobility during ADLs: Contact guard assist;Supervision/safety;Cueing for safety       Vision         Perception         Praxis         Pertinent Vitals/Pain Pain Assessment Pain Assessment: No/denies pain     Extremity/Trunk Assessment Upper Extremity Assessment Upper Extremity Assessment: Overall WFL  for tasks assessed   Lower Extremity Assessment Lower Extremity Assessment: Overall WFL for tasks assessed       Communication Communication Communication: No apparent difficulties   Cognition Arousal: Alert Behavior During Therapy: WFL for tasks assessed/performed Cognition: Cognition impaired   Orientation impairments: Place, Time, Situation Awareness: Online awareness impaired Memory impairment (select all impairments): Short-term memory Attention impairment (select first level of impairment): Sustained attention, Selective attention, Alternating attention, Divided attention   OT - Cognition Comments: only oriented to person; could only remember his granddaughter was in the room but unsure of who else was there; able to state his daughters names                 Following commands: Impaired Following commands impaired: Follows one step commands with increased time     Cueing  General Comments   Cueing Techniques: Verbal cues  97% sp02 on 2L on entry, removed and placed on RA throughout session with sp02 95% throughout   Exercises Other Exercises Other Exercises: Edu on role of OT in acute setting.   Shoulder Instructions      Home Living Family/patient expects to be discharged to:: Private residence Living Arrangements: Alone Available Help at Discharge: Family;Available PRN/intermittently Type of Home: House Home Access: Stairs to enter [employer] of Steps: 4-5 Entrance Stairs-Rails: Right Home Layout: One level     Bathroom Shower/Tub: Chief Strategy Officer: Standard     Home Equipment: None   Additional Comments: family provided history  Lives With: Family    Prior Functioning/Environment Prior Level of Function : Independent/Modified Independent             Mobility Comments: IND; 1 recent fall a few days ago ADLs Comments: IND    OT Problem List: Decreased cognition;Decreased safety awareness;Impaired balance  (sitting and/or standing)   OT Treatment/Interventions: Self-care/ADL training;Therapeutic exercise;Therapeutic activities;DME and/or AE instruction;Balance training;Patient/family education      OT Goals(Current goals can be found in the care plan section)   Acute Rehab OT Goals Patient Stated Goal: improve cognition OT Goal Formulation: With patient/family Time For Goal Achievement: 04/21/24 Potential to Achieve Goals: Good ADL Goals Pt Will Perform Lower Body Dressing: Independently;sit to/from stand Pt Will Transfer to Toilet: Independently;ambulating;regular height toilet Pt Will Perform Toileting - Clothing Manipulation and hygiene: Independently;sit to/from stand   OT Frequency:  Min 1X/week    Co-evaluation              AM-PAC OT "6 Clicks" Daily Activity     Outcome Measure Help from another person eating meals?: None Help from another person taking care of personal grooming?: None Help from another person toileting, which includes using toliet, bedpan, or urinal?: A Little Help from another person bathing (including washing, rinsing, drying)?: A Little Help from another person to put on and taking off regular upper  body clothing?: None Help from another person to put on and taking off regular lower body clothing?: None 6 Click Score: 22   End of Session Equipment Utilized During Treatment: Gait belt Nurse Communication: Mobility status  Activity Tolerance: Patient tolerated treatment well Patient left: in chair;with call bell/phone within reach;with chair alarm set;with family/visitor present;with nursing/sitter in room  OT Visit Diagnosis: Other abnormalities of gait and mobility (R26.89)                Time: 1610-9604 OT Time Calculation (min): 29 min Charges:  OT General Charges $OT Visit: 1 Visit OT Evaluation $OT Eval Moderate Complexity: 1 Mod OT Treatments $Self Care/Home Management : 8-22 mins Aanvi Voyles, OTR/L  04/07/24, 10:36 AM  Saphronia Ozdemir  E Jb Dulworth 04/07/2024, 10:32 AM

## 2024-04-07 NOTE — TOC Initial Note (Addendum)
 Transition of Care [facility]) - Initial/Assessment Note    Patient Details  Name: Barry Sampson MRN: [id number] Date of Birth: [birth year]  [facility] CM/SW Contact:    Crayton Docker, RN 04/07/2024, 12:00 PM  Clinical Narrative:                  CM to patient's room regarding TOC screening assessment. CM introduced case management role and discharge care planning process. Per patient verbalized agreement. Patient's daughter, Moira Andrews at patient's bedside.  Patient lives alone. Per patient's daughter, Moira Andrews, patient can return home with patient's daughter.   Noted, no insurance and no PCP. CM will send Medicaid referral to Artie Bimler and provide application to [facility] to patient's daughter, Moira Andrews for completion.  Dr. Hubert Madden arrived to bedside during screening interview to discuss care and discharge care planning. Per Dr. Hubert Madden, urine drug screen, was positive for cocaine and marijuana.   CM provided list of substance abuse resources to patient's daughter, Moira Andrews.   CM secure email to Artie Bimler, Artist for [employer] screening.  Expected Discharge Plan: Home/Self Care Barriers to Discharge: Continued Medical Work up   Patient Goals and CMS Choice    Home/self care  Expected Discharge Plan and Services    Home/self care   Prior Living Arrangements/Services   Lives with:: Self Patient language and need for interpreter reviewed:: No        Need for Family Participation in Patient Care: Yes (Comment) Care giver support system in place?: Yes (comment)   Criminal Activity/Legal Involvement Pertinent to Current Situation/Hospitalization: No - Comment as needed  Activities of Daily Living   ADL Screening (condition at time of admission) Independently performs ADLs?: Yes (appropriate for developmental age) Is the patient deaf or have difficulty hearing?: No Does the patient have difficulty seeing, even when wearing glasses/contacts?:  No Does the patient have difficulty concentrating, remembering, or making decisions?: No  Permission Sought/Granted Permission sought to share information with : Case Manager Permission granted to share information with : Yes, Verbal Permission Granted  Share Information with NAME: Satira Curet     Permission granted to share info w Relationship: Daughter  Permission granted to share info w Contact Information: yes  Emotional Assessment Appearance:: Appears stated age Attitude/Demeanor/Rapport: Engaged Affect (typically observed): Calm Orientation: : Oriented to Place, Oriented to Self Alcohol / Substance Use: Illicit Drugs    Admission diagnosis:  Cocaine use [F14.90] Marijuana use [F12.90] Altered mental status, unspecified altered mental status type [R41.82] Acute metabolic encephalopathy [G93.41] Patient Active Problem List   Diagnosis Date Noted   Cocaine abuse (HCC) 04/07/2024   CAD S/P percutaneous coronary angioplasty 04/07/2024   HTN (hypertension) 04/07/2024   Tobacco use disorder 04/07/2024   History of systolic CHF (congestive heart failure) 04/07/2024   Hypoxia 04/07/2024   COPD (chronic obstructive pulmonary disease) (HCC)    Acute metabolic encephalopathy 04/06/2024   PCP:  Pcp, No Pharmacy:  No Pharmacies Listed    Social Drivers of Health (SDOH) Social History: SDOH Screenings   Food Insecurity: No Food Insecurity (04/07/2024)  Housing: Low Risk  (04/07/2024)  Transportation Needs: No Transportation Needs (04/07/2024)  Utilities: Not At Risk (04/07/2024)  Social Connections: Patient Declined (04/07/2024)  Tobacco Use: High Risk (04/07/2024)   SDOH Interventions:     Readmission Risk Interventions     No data to display

## 2024-04-07 NOTE — Assessment & Plan Note (Signed)
Not currently exacerbated DuoNebs as needed  

## 2024-04-07 NOTE — Progress Notes (Signed)
 [facility] Progress Note  Patient: Barry Sampson    WUJ:[id number]  DOA: 04/06/2024     Date of Service: the patient was seen and examined on 04/07/2024  Chief Complaint  Patient presents with   Altered Mental Status   Brief hospital course: [name] is a 68 y.o. male with medical history significant for CAD s/p PCI, LBBB, systolic CHF (EF 95-62%), HTN, COPD, tobacco abuse, past history of cocaine abuse, brought in by EMS after he was found wandering.  History in Care Everywhere reviewed.  He was last hospitalized in 2015 for COPD exacerbation and has no regular healthcare based on Care Everywhere review.  At the bedside is patient's daughter and granddaughter who give more information.  They state that patient left home about 9 PM the day prior to pick someone up however he never needed to picking the person up and then today he was found wandering around a store in the clinic at the store who knows the family called them and subsequently called EMS to bring him in.  Daughter at the bedside states that up until a couple weeks ago he was completely his normal self and has no history of memory deficit however over the past 2 weeks they noticed that he was forgetting stuff and on 1 occasion he found his glasses in the freezer.  He fell about 4 days ago and has no history of prior falls.  Patient is presently oriented to self only ED course and data review: BP on arrival 166/92 with otherwise normal vitals. Labs notable mainly for UDS positive for cocaine and cannabinoids.  Other lab work including CBC, BMP, hepatic function, EtOH, ammonia, troponin, UA were all unremarkable .  EKG, personally viewed and interpreted showing sinus rhythm with LBBB Chest x-ray nonacute Head CT nonacute but showing generalized cerebral atrophy with ventricular dilatation   Hospitalist consulted for admission.   Assessment and Plan:   # Acute metabolic encephalopathy Most likely due to Polysubstance  abuse-cocaine and cannabinoid causing worsening of underlying cognitive deficit, Likely multifactorial and related primarily to substance abuse CT scan and MRI negative for any acute findings, no stroke. No evidence of acute infection No focal neurologic deficit Drug abuse abstinence counseling done. TOC consulted for resources.   # Hypoxia, (Acute respiratory failure with hypoxia) Patient became hypoxic to 85% on room air following admission, placed on O2.  Hypoxia resolved, currently patient saturating well on room air D-dimer 0.74, slightly elevated, not very significant.  Patient denies any symptoms, less likely PE. Chest x-ray was nonacute   Tobacco use disorder: Nicotine patch   HTN (hypertension) Monitor, currently patient is not on any medication  CAD S/P percutaneous coronary angioplasty History of systolic CHF (EF 25 to 30% 2015) LBBB, known No chest pain, shortness of breath EKG is nonacute Patient is currently on no meds   Hyperlipidemia, LDL 168, goal <100 Started Lipitor 40 mg p.o. daily.  Follow with PCP to repeat lipid profile after 3 to 6 months and titrate dose of Lipitor accordingly.  COPD (chronic obstructive pulmonary disease)  Not currently exacerbated DuoNebs as needed    Body mass index is 27.98 kg/m.  Interventions:  Diet: Regular diet DVT Prophylaxis: Subcutaneous Lovenox   Advance goals of care discussion: Full code  Family Communication: family was present at bedside, at the time of interview.  The pt provided permission to discuss medical plan with the family. Opportunity was given to ask question and all questions were answered satisfactorily.  6/6 d/w patient's daughter at bedside   Disposition:  Pt is from home, admitted with acute metabolic encephalopathy, still has altered mental status and confused, which precludes a safe discharge. Discharge to home, when stable, most likely tomorrow a.m.  Subjective: No significant events  overnight, currently patient is AO x 2, still slightly confused, not very alert.  Denied any specific complaints, no chest pain or 50s, no shortness of breath.  Physical Exam: General: NAD, lying comfortably Appear in no distress, affect appropriate Eyes: PERRLA ENT: Oral Mucosa Clear, moist  Neck: no JVD,  Cardiovascular: S1 and S2 Present, no Murmur,  Respiratory: good respiratory effort, Bilateral Air entry equal and Decreased, no Crackles, no wheezes Abdomen: Bowel Sound present, Soft and no tenderness,  Skin: no rashes Extremities: no Pedal edema, no calf tenderness Neurologic: without any new focal findings Gait not checked due to patient safety concerns  Vitals:   04/07/24 0700 04/07/24 0730 04/07/24 0904 04/07/24 1218  BP: (!) 144/79 139/82 (!) 165/79 (!) 141/86  Pulse: 73 64 67 71  Resp: 15 (!) 24 19 19   Temp:   98.5 F (36.9 C) 98 F (36.7 C)  TempSrc:    Oral  SpO2: 93% (!) 85% 96% 96%  Weight:      Height:       No intake or output data in the 24 hours ending 04/07/24 1330 Filed Weights   04/06/24 1921  Weight: 88.5 kg    Data Reviewed: I have personally reviewed and interpreted daily labs, tele strips, imagings as discussed above. I reviewed all nursing notes, pharmacy notes, vitals, pertinent old records I have discussed plan of care as described above with RN and patient/family.  CBC: Recent Labs  Lab 04/06/24 1928 04/07/24 0919  WBC 7.5 7.8  NEUTROABS 3.7  --   HGB 14.9 14.8  HCT 44.3 44.0  MCV 96.3 94.4  PLT 188 165   Basic Metabolic Panel: Recent Labs  Lab 04/06/24 1928 04/07/24 0919  NA 138 137  K 3.8 4.0  CL 104 106  CO2 25 23  GLUCOSE 100* 102*  BUN 14 14  CREATININE 1.07 0.81  CALCIUM 9.0 8.7*  MG  --  2.2  PHOS  --  2.8    Studies: MR BRAIN WO CONTRAST Result Date: 04/07/2024 CLINICAL DATA:  Neuro deficit, acute, stroke suspected EXAM: MRI HEAD WITHOUT CONTRAST TECHNIQUE: Multiplanar, multiecho pulse sequences of the brain  and surrounding structures were obtained without intravenous contrast. COMPARISON:  CT head June 5, 25. FINDINGS: Brain: No acute infarction, hemorrhage, hydrocephalus, extra-axial collection or mass lesion. Vascular: Normal flow voids. Skull and upper cervical spine: Normal marrow signal. Sinuses/Orbits: Negative. IMPRESSION: No evidence of acute intracranial abnormality. Electronically Signed   By: Stevenson Elbe M.D.   On: 04/07/2024 02:04   DG Chest 1 View Result Date: 04/06/2024 CLINICAL DATA:  Altered mental status. EXAM: CHEST  1 VIEW COMPARISON:  02/17/2016 FINDINGS: The heart size and mediastinal contours are within normal limits. Dependent bibasilar linear subsegmental atelectasis or scarring. No pneumonia or pulmonary edema. No pneumothorax or pleural effusion. The visualized skeletal structures are unremarkable. IMPRESSION: No active disease. Electronically Signed   By: Sydell Eva M.D.   On: 04/06/2024 20:14   CT Head Wo Contrast Result Date: 04/06/2024 CLINICAL DATA:  Altered mental status. EXAM: CT HEAD WITHOUT CONTRAST TECHNIQUE: Contiguous axial images were obtained from the base of the skull through the vertex without intravenous contrast. RADIATION DOSE REDUCTION: This exam was performed according  to the departmental dose-optimization program which includes automated exposure control, adjustment of the mA and/or kV according to patient size and/or use of iterative reconstruction technique. COMPARISON:  None Available. FINDINGS: Brain: There is mild cerebral atrophy with widening of the extra-axial spaces and ventricular dilatation. There are areas of decreased attenuation within the white matter tracts of the supratentorial brain, consistent with microvascular disease changes. Vascular: Mild to moderate severity bilateral cavernous carotid artery calcification is noted. Skull: Normal. Negative for fracture or focal lesion. Sinuses/Orbits: No acute finding. Other: None. IMPRESSION: 1.  No acute intracranial abnormality. 2. Generalized cerebral atrophy with widening of the extra-axial spaces and ventricular dilatation. Electronically Signed   By: Virgle Grime M.D.   On: 04/06/2024 20:14    Scheduled Meds:  [START ON 04/08/2024]  stroke: early stages of recovery book   Does not apply Once   enoxaparin (LOVENOX) injection  40 mg Subcutaneous Q24H   Continuous Infusions: PRN Meds: acetaminophen  **OR** acetaminophen  (TYLENOL ) oral liquid 160 mg/5 mL **OR** acetaminophen   Time spent: 35 minutes  Author: Althia Atlas. MD [facility] 04/07/2024 1:30 PM  To reach On-call, see care teams to locate the attending and reach out to them via [email or web address]. If 7PM-7AM, please contact night-coverage If you still have difficulty reaching the attending provider, please page the [facility] (Director on Call) for [facility] on amion for assistance.

## 2024-04-07 NOTE — Progress Notes (Signed)
 Per Dr Hubert Madden, dc [facility]/stroke orders

## 2024-04-07 NOTE — Progress Notes (Signed)
 Mobility Specialist - Progress Note     04/07/24 1237  Mobility  Activity Stood at bedside;Ambulated with assistance in hallway  Level of Assistance Contact guard assist, steadying assist  Assistive Device Front wheel walker  Distance Ambulated (ft) 200 ft  Range of Motion/Exercises Active  Activity Response Tolerated well  Mobility Referral Yes  Mobility visit 1 Mobility  Mobility Specialist Start Time (ACUTE ONLY) 1214  Mobility Specialist Stop Time (ACUTE ONLY) 1232  Mobility Specialist Time Calculation (min) (ACUTE ONLY) 18 min   Pt resting in bed on RA upon entry. Pt STS and ambulates to hallway around NS CGA with RW. Pt required extra time to answer question and multiple repetitions. Pt ambulated the entire time without O2. Pt returned to bed and left with needs in reach. Bed alarm activated.   Jerri Morale Mobility Specialist 04/07/24, 2:26 PM

## 2024-04-07 NOTE — Evaluation (Signed)
 Speech Language Pathology Evaluation Patient Details Name: SAMULE LIFE MRN: [id number] DOB: 03-20-1956 Today's Date: 04/07/2024 Time: 0865-7846 SLP Time Calculation (min) (ACUTE ONLY): 12 min  Problem List:  Patient Active Problem List   Diagnosis Date Noted   Cocaine abuse (HCC) 04/07/2024   CAD S/P percutaneous coronary angioplasty 04/07/2024   HTN (hypertension) 04/07/2024   Tobacco use disorder 04/07/2024   History of systolic CHF (congestive heart failure) 04/07/2024   Hypoxia 04/07/2024   COPD (chronic obstructive pulmonary disease) (HCC)    Acute metabolic encephalopathy 04/06/2024   Past Medical History:  Past Medical History:  Diagnosis Date   COPD (chronic obstructive pulmonary disease) (HCC)    Coronary artery disease    Past Surgical History:  Past Surgical History:  Procedure Laterality Date   CORONARY STENT PLACEMENT     HPI:  WELFORD CHRISTMAS is a 68 y.o. male with medical history significant for CAD s/p PCI, LBBB, systolic CHF (EF 96-29%), HTN, COPD, tobacco abuse, past history of cocaine abuse, brought in by EMS after he was found wandering.  History in Care Everywhere reviewed.  He was last hospitalized in 2015 for COPD exacerbation and has no regular healthcare based on Care Everywhere review.  At the bedside is patient's daughter and granddaughter who give more information.  They state that patient left home about 9 PM the day prior to pick someone up however he never needed to picking the person up and then today he was found wandering around a store in the clinic at the store who knows the family called them and subsequently called EMS to bring him in.  Daughter at the bedside states that up until a couple weeks ago he was completely his normal self and has no history of memory deficit however over the past 2 weeks they noticed that he was forgetting stuff and on 1 occasion he found his glasses in the freezer.  He fell about 4 days ago and has no history of  prior falls.  Patient is presently oriented to self only. MRI on 04/07/2024 was negative for any acute processes.   Assessment / Plan / Recommendation Clinical Impression  Pt presents with the appearance of improved encephalopathy, he is awake, alert, oriented to himself, family member and place. Pt with clear speech, is able to follow simple 1 step directions, is appropriately attentive to this Clinical research associate and task, and joked with this Clinical research associate x 3. Family member at bedside reports improved and return to baseline functioning. Of note, pt consumed thin liquids without overt s/s of aspiration and he passed stroke swallow screen. At this time a regular diet appears appropriate.    SLP Assessment  SLP Recommendation/Assessment: Patient does not need any further Speech Language Pathology Services SLP Visit Diagnosis: Cognitive communication deficit (R41.841)     Assistance Recommended at Discharge   TBD  Functional Status Assessment Patient has not had a recent decline in their functional status  Frequency and Duration   N/A        SLP Evaluation Cognition  Overall Cognitive Status: History of cognitive impairments - at baseline Arousal/Alertness: Awake/alert Orientation Level: Oriented to person;Oriented to place;Disoriented to time;Disoriented to situation       Comprehension  Auditory Comprehension Overall Auditory Comprehension: Appears within functional limits for tasks assessed Yes/No Questions: Within Functional Limits Commands: Within Functional Limits Conversation: Simple    Expression Expression Primary Mode of Expression: Verbal Verbal Expression Overall Verbal Expression: Appears within functional limits for tasks assessed  Oral / Motor  Oral Motor/Sensory Function Overall Oral Motor/Sensory Function: Within functional limits Motor Speech Overall Motor Speech: Appears within functional limits for tasks assessed           Zamara Cozad B. Garlin Junker, M.S., CCC-SLP,  Tree surgeon Certified Brain Injury Specialist [facility] Office [phone] Ascom [phone] Fax [phone]

## 2024-04-07 NOTE — Assessment & Plan Note (Addendum)
 Suspect underlying cognitive deficit, versus occult CVA Polysubstance abuse-cocaine and cannabinoid Likely multifactorial and related primarily to substance abuse but with underlying possible cognitive deficit in view of cerebral atrophy seen on head CT. No evidence of acute infection No focal neurologic deficit Will get MRI for further evaluation for possible occult CVA given fall and symptom onset only within the past couple weeks Continuous cardiac monitoring, echocardiogram Neurologic checks with fall and aspiration precautions

## 2024-04-07 NOTE — H&P (Signed)
 History and Physical    Patient: Barry Sampson ZOX:[id number] DOB: Sep 24, 1956 DOA: 04/06/2024 DOS: the patient was seen and examined on 04/07/2024 PCP: Pcp, No  Patient coming from: Home  Chief Complaint:  Chief Complaint  Patient presents with   Altered Mental Status    HPI: Barry Sampson is a 68 y.o. male with medical history significant for CAD s/p PCI, LBBB, systolic CHF (EF 81-19%), HTN, COPD, tobacco abuse, past history of cocaine abuse, brought in by EMS after he was found wandering.  History in Care Everywhere reviewed.  He was last hospitalized in 2015 for COPD exacerbation and has no regular healthcare based on Care Everywhere review.  At the bedside is patient's daughter and granddaughter who give more information.  They state that patient left home about 9 PM the day prior to pick someone up however he never needed to picking the person up and then today he was found wandering around a store in the clinic at the store who knows the family called them and subsequently called EMS to bring him in.  Daughter at the bedside states that up until a couple weeks ago he was completely his normal self and has no history of memory deficit however over the past 2 weeks they noticed that he was forgetting stuff and on 1 occasion he found his glasses in the freezer.  He fell about 4 days ago and has no history of prior falls.  Patient is presently oriented to self only ED course and data review: BP on arrival 166/92 with otherwise normal vitals. Labs notable mainly for UDS positive for cocaine and cannabinoids.  Other lab work including CBC, BMP, hepatic function, EtOH, ammonia, troponin, UA were all unremarkable .  EKG, personally viewed and interpreted showing sinus rhythm with LBBB Chest x-ray nonacute Head CT nonacute but showing generalized cerebral atrophy with ventricular dilatation  Hospitalist consulted for admission.     Past Medical History:  Diagnosis Date   COPD (chronic  obstructive pulmonary disease) (HCC)    Coronary artery disease    Past Surgical History:  Procedure Laterality Date   CORONARY STENT PLACEMENT     Social History:  reports that he has been smoking cigarettes. He does not have any smokeless tobacco history on file. He reports current drug use. Drug: Marijuana. He reports that he does not drink alcohol.  No Known Allergies  History reviewed. No pertinent family history.  Prior to Admission medications   Medication Sig Start Date End Date Taking? Authorizing Provider  amoxicillin  (AMOXIL ) 500 MG capsule Take 1 capsule (500 mg total) by mouth 3 (three) times daily. Patient not taking: Reported on 04/07/2024 02/17/16   Ainsley Houston, MD  HYDROcodone -acetaminophen  (NORCO/VICODIN) 5-325 MG tablet Take 1-2 tablets by mouth every 4 (four) hours as needed for moderate pain. Patient not taking: Reported on 04/07/2024 02/24/16   Lynnda Sas, MD  ofloxacin  (OCUFLOX ) 0.3 % ophthalmic solution Place 10 drops into the right ear 2 (two) times daily. Patient not taking: Reported on 04/07/2024 02/17/16   Ainsley Houston, MD    Physical Exam: Vitals:   04/06/24 1921 04/06/24 2000 04/06/24 2330 04/07/24 0030  BP:  (!) 164/93 130/60 (!) 142/71  Pulse:  79 73 70  Resp:  18    Temp:      TempSrc:      SpO2:  97% (!) 85% 94%  Weight: 88.5 kg     Height: 5\' 10"  (1.778 m)      Physical Exam  Vitals and nursing note reviewed.  Constitutional:      General: He is not in acute distress. HENT:     Head: Normocephalic and atraumatic.  Cardiovascular:     Rate and Rhythm: Normal rate and regular rhythm.     Heart sounds: Normal heart sounds.  Pulmonary:     Effort: Pulmonary effort is normal.     Breath sounds: Normal breath sounds.  Abdominal:     Palpations: Abdomen is soft.     Tenderness: There is no abdominal tenderness.  Neurological:     Mental Status: Mental status is at baseline.     Labs on Admission: I have personally reviewed following labs  and imaging studies  CBC: Recent Labs  Lab 04/06/24 1928  WBC 7.5  NEUTROABS 3.7  HGB 14.9  HCT 44.3  MCV 96.3  PLT 188   Basic Metabolic Panel: Recent Labs  Lab 04/06/24 1928  NA 138  K 3.8  CL 104  CO2 25  GLUCOSE 100*  BUN 14  CREATININE 1.07  CALCIUM 9.0   GFR: Estimated Creatinine Clearance: 73 mL/min (by C-G formula based on SCr of 1.07 mg/dL). Liver Function Tests: Recent Labs  Lab 04/06/24 1928  AST 35  ALT 36  ALKPHOS 41  BILITOT 0.8  PROT 7.9  ALBUMIN 4.0   No results for input(s): "LIPASE", "AMYLASE" in the last 168 hours. Recent Labs  Lab 04/06/24 1957  AMMONIA 20   Coagulation Profile: No results for input(s): "INR", "PROTIME" in the last 168 hours. Cardiac Enzymes: No results for input(s): "CKTOTAL", "CKMB", "CKMBINDEX", "TROPONINI" in the last 168 hours. BNP (last 3 results) No results for input(s): "PROBNP" in the last 8760 hours. HbA1C: No results for input(s): "HGBA1C" in the last 72 hours. CBG: No results for input(s): "GLUCAP" in the last 168 hours. Lipid Profile: No results for input(s): "CHOL", "HDL", "LDLCALC", "TRIG", "CHOLHDL", "LDLDIRECT" in the last 72 hours. Thyroid Function Tests: No results for input(s): "TSH", "T4TOTAL", "FREET4", "T3FREE", "THYROIDAB" in the last 72 hours. Anemia Panel: No results for input(s): "VITAMINB12", "FOLATE", "FERRITIN", "TIBC", "IRON", "RETICCTPCT" in the last 72 hours. Urine analysis:    Component Value Date/Time   COLORURINE YELLOW (A) 04/06/2024 2117   APPEARANCEUR CLEAR (A) 04/06/2024 2117   LABSPEC 1.026 04/06/2024 2117   PHURINE 6.0 04/06/2024 2117   GLUCOSEU NEGATIVE 04/06/2024 2117   HGBUR NEGATIVE 04/06/2024 2117   BILIRUBINUR NEGATIVE 04/06/2024 2117   KETONESUR NEGATIVE 04/06/2024 2117   PROTEINUR NEGATIVE 04/06/2024 2117   NITRITE NEGATIVE 04/06/2024 2117   LEUKOCYTESUR NEGATIVE 04/06/2024 2117    Radiological Exams on Admission: DG Chest 1 View Result Date:  04/06/2024 CLINICAL DATA:  Altered mental status. EXAM: CHEST  1 VIEW COMPARISON:  02/17/2016 FINDINGS: The heart size and mediastinal contours are within normal limits. Dependent bibasilar linear subsegmental atelectasis or scarring. No pneumonia or pulmonary edema. No pneumothorax or pleural effusion. The visualized skeletal structures are unremarkable. IMPRESSION: No active disease. Electronically Signed   By: Sydell Eva M.D.   On: 04/06/2024 20:14   CT Head Wo Contrast Result Date: 04/06/2024 CLINICAL DATA:  Altered mental status. EXAM: CT HEAD WITHOUT CONTRAST TECHNIQUE: Contiguous axial images were obtained from the base of the skull through the vertex without intravenous contrast. RADIATION DOSE REDUCTION: This exam was performed according to the departmental dose-optimization program which includes automated exposure control, adjustment of the mA and/or kV according to patient size and/or use of iterative reconstruction technique. COMPARISON:  None Available. FINDINGS: Brain:  There is mild cerebral atrophy with widening of the extra-axial spaces and ventricular dilatation. There are areas of decreased attenuation within the white matter tracts of the supratentorial brain, consistent with microvascular disease changes. Vascular: Mild to moderate severity bilateral cavernous carotid artery calcification is noted. Skull: Normal. Negative for fracture or focal lesion. Sinuses/Orbits: No acute finding. Other: None. IMPRESSION: 1. No acute intracranial abnormality. 2. Generalized cerebral atrophy with widening of the extra-axial spaces and ventricular dilatation. Electronically Signed   By: Virgle Grime M.D.   On: 04/06/2024 20:14   Data Reviewed for HPI: Relevant notes from primary care and specialist visits, past discharge summaries as available in EHR, including Care Everywhere. Prior diagnostic testing as pertinent to current admission diagnoses Updated medications and problem lists for  reconciliation ED course, including vitals, labs, imaging, treatment and response to treatment Triage notes, nursing and pharmacy notes and ED provider's notes Notable results as noted above in HPI      Assessment and Plan: * Acute metabolic encephalopathy Suspect underlying cognitive deficit, versus occult CVA Polysubstance abuse-cocaine and cannabinoid Likely multifactorial and related primarily to substance abuse but with underlying possible cognitive deficit in view of cerebral atrophy seen on head CT. No evidence of acute infection No focal neurologic deficit Will get MRI for further evaluation for possible occult CVA given fall and symptom onset only within the past couple weeks Continuous cardiac monitoring, echocardiogram Neurologic checks with fall and aspiration precautions  Hypoxia (Acute respiratory failure with hypoxia) Patient became hypoxic to 85% on room air following admission, placed on O2 Will get D-dimer with further workup with elevated Chest x-ray was nonacute Continue nasal cannula  Tobacco use disorder Nicotine patch  HTN (hypertension) Holding home meds during stroke workup  CAD S/P percutaneous coronary angioplasty History of systolic CHF (EF 25 to 30% 2015) LBBB, known No chest pain, shortness of breath EKG is nonacute Patient is currently on no meds  COPD (chronic obstructive pulmonary disease) (HCC) Not currently exacerbated DuoNebs as needed     DVT prophylaxis: Lovenox  Consults: none  Advance Care Planning:   Code Status: Full Code   Family Communication: Daughter and granddaughter at bedside  Disposition Plan: Back to previous home environment  Severity of Illness: The appropriate patient status for this patient is OBSERVATION. Observation status is judged to be reasonable and necessary in order to provide the required intensity of service to ensure the patient's safety. The patient's presenting symptoms, physical exam  findings, and initial radiographic and laboratory data in the context of their medical condition is felt to place them at decreased risk for further clinical deterioration. Furthermore, it is anticipated that the patient will be medically stable for discharge from the hospital within 2 midnights of admission.   Author: Lanetta Pion, MD 04/07/2024 1:37 AM  For on call review [email or web address].

## 2024-04-07 NOTE — Assessment & Plan Note (Signed)
 Nicotine  patch

## 2024-04-08 ENCOUNTER — Inpatient Hospital Stay
Admit: 2024-04-08 | Discharge: 2024-04-08 | Disposition: A | Discharge status: Home / Self Care | Attending: Internal Medicine | Admitting: Internal Medicine

## 2024-04-08 DIAGNOSIS — G9341 Metabolic encephalopathy: Secondary | ICD-10-CM

## 2024-04-08 LAB — ECHOCARDIOGRAM COMPLETE
AR max vel: 1.34 cm2
AV Area VTI: 1.54 cm2
AV Area mean vel: 1.39 cm2
AV Mean grad: 6 mmHg
AV Peak grad: 11.6 mmHg
Ao pk vel: 1.7 m/s
Area-P 1/2: 2.87 cm2
Calc EF: 25.1 %
Height: 70 in
MV VTI: 1.76 cm2
P 1/2 time: 681 ms
S' Lateral: 4.3 cm
Single Plane A2C EF: 29.5 %
Single Plane A4C EF: 26.9 %
Weight: 3120 [oz_av]

## 2024-04-08 LAB — BASIC METABOLIC PANEL WITH GFR
Anion gap: 7 (ref 5–15)
BUN: 15 mg/dL (ref 8–23)
CO2: 25 mmol/L (ref 22–32)
Calcium: 8.7 mg/dL — ABNORMAL LOW (ref 8.9–10.3)
Chloride: 107 mmol/L (ref 98–111)
Creatinine, Ser: 0.95 mg/dL (ref 0.61–1.24)
GFR, Estimated: >60 mL/min (ref >60)
Glucose, Bld: 112 mg/dL — ABNORMAL HIGH (ref 70–99)
Potassium: 3.7 mmol/L (ref 3.5–5.1)
Sodium: 139 mmol/L (ref 135–145)

## 2024-04-08 LAB — CBC
HCT: 42.9 % (ref 39.0–52.0)
Hemoglobin: 14.6 g/dL (ref 13.0–17.0)
MCH: 31.7 pg (ref 26.0–34.0)
MCHC: 34 g/dL (ref 30.0–36.0)
MCV: 93.3 fL (ref 80.0–100.0)
Platelets: 172 10*3/uL (ref 150–400)
RBC: 4.6 MIL/uL (ref 4.22–5.81)
RDW: 12.6 % (ref 11.5–15.5)
WBC: 7 10*3/uL (ref 4.0–10.5)
nRBC: 0 % (ref 0.0–0.2)

## 2024-04-08 LAB — MAGNESIUM: Magnesium: 2.3 mg/dL (ref 1.7–2.4)

## 2024-04-08 LAB — PHOSPHORUS: Phosphorus: 3.3 mg/dL (ref 2.5–4.6)

## 2024-04-08 MED ORDER — AMLODIPINE BESYLATE 5 MG PO TABS: 5.0000 mg | ORAL_TABLET | Freq: Every day | ORAL | Status: DC

## 2024-04-08 MED ORDER — LISINOPRIL 10 MG PO TABS
10.0000 mg | ORAL_TABLET | Freq: Every day | ORAL | Status: DC
  Administered 2024-04-08 – 2024-04-15 (×8): 10 mg via ORAL
  Filled 2024-04-08 (×8): qty 1

## 2024-04-08 NOTE — Progress Notes (Signed)
 Patient keeps pulling TELE leads off. This RN has been in patient's room multiple times to replace and educate patient to keep leads on. Patient mocked this RN and when I left took leads off. CCMD aware that patient is noncompliant.

## 2024-04-08 NOTE — Plan of Care (Signed)

## 2024-04-08 NOTE — Progress Notes (Signed)
 Progress Note   Patient: Barry Sampson:[id number] DOB: 09-01-1956 DOA: 04/06/2024     1 DOS: the patient was seen and examined on 04/08/2024   Brief hospital course: NAJIR ROOP is a 68 y.o. male with medical history significant for CAD s/p PCI, LBBB, systolic CHF (EF 86-57%), HTN, COPD, tobacco abuse, past history of cocaine abuse, brought in by EMS after he was found wandering.  History in Care Everywhere reviewed.  He was last hospitalized in 2015 for COPD exacerbation and has no regular healthcare based on Care Everywhere review.  At the bedside is patient's daughter and granddaughter who give more information.  They state that patient left home about 9 PM the day prior to pick someone up however he never needed to picking the person up and then today he was found wandering around a store in the clinic at the store who knows the family called them and subsequently called EMS to bring him in.  Daughter at the bedside states that up until a couple weeks ago he was completely his normal self and has no history of memory deficit however over the past 2 weeks they noticed that he was forgetting stuff and on 1 occasion he found his glasses in the freezer.  He fell about 4 days ago and has no history of prior falls.  Patient is presently oriented to self only ED course and data review: BP on arrival 166/92 with otherwise normal vitals. Labs notable mainly for UDS positive for cocaine and cannabinoids.  Other lab work including CBC, BMP, hepatic function, EtOH, ammonia, troponin, UA were all unremarkable EKG, personally viewed and interpreted showing sinus rhythm with LBBB Chest x-ray nonacute Head CT nonacute but showing generalized cerebral atrophy with ventricular dilatation   Hospitalist consulted for admission.     Assessment and Plan:  # Acute metabolic encephalopathy Most likely due to Polysubstance abuse-cocaine and cannabinoid causing worsening of underlying cognitive  deficit, Likely multifactorial and related primarily to substance use/abuse CT scan and MRI negative for any acute findings, no stroke. No evidence of acute infection No focal neurologic deficit Drug abuse abstinence counseling done. TOC consulted for resources.     # Hypoxia, (Acute respiratory failure with hypoxia) Patient became hypoxic to 85% on room air following admission, placed on O2.   Hypoxia resolved, currently patient saturating well on room air D-dimer 0.74, slightly elevated, not very significant.  Patient denies any symptoms, less likely PE. Chest x-ray was nonacute    Tobacco use disorder:  Continue Nicotine patch    HTN (hypertension) Start patient on lisinopril 10 mg daily   CAD S/P percutaneous coronary angioplasty History of systolic CHF (EF 25 to 30% 2015) LBBB, known No chest pain, shortness of breath EKG is nonacute Start patient on lisinopril. Hold off on beta-blockers due to history of cocaine use Follow-up results of repeat 2D echocardiogram    Hyperlipidemia, LDL 168, goal <100 Started Lipitor 40 mg p.o. daily.   Follow with PCP to repeat lipid profile after 3 to 6 months and titrate dose of Lipitor accordingly.    COPD (chronic obstructive pulmonary disease)  Not currently exacerbated DuoNebs as needed     Body mass index is 27.98 kg/m.  Interventions:         Subjective: No new complaints.  Family at the bedside  Physical Exam: Vitals:   04/07/24 1941 04/07/24 2336 04/08/24 0354 04/08/24 0725  BP: 131/78 (!) 109/95 (!) 141/85 (!) 150/93  Pulse: 89 76 72  67  Resp: 18 16 16 16   Temp: 98.2 F (36.8 C) 99 F (37.2 C) 97.8 F (36.6 C)   TempSrc: Oral Oral    SpO2: 92% 97% 100% 96%  Weight:      Height:       General: NAD, lying comfortably Appear in no distress, affect appropriate Eyes: PERRLA ENT: Oral Mucosa Clear, moist  Neck: no JVD,  Cardiovascular: S1 and S2 Present, no Murmur,  Respiratory: good respiratory  effort, Bilateral Air entry equal and Decreased, no Crackles, no wheezes Abdomen: Bowel Sound present, Soft and no tenderness,  Skin: no rashes Extremities: no Pedal edema, no calf tenderness Neurologic: without any new focal findings     Data Reviewed: Labs reviewed  Family Communication: Plan of care discussed with patient, his daughter and nephew at the bedside.  All questions and concerns have been addressed.  They verbalized understanding and agreement with the plan.  Disposition: Status is: Inpatient Remains inpatient appropriate because: Patient improved but not back to baseline.  Planned Discharge Destination: Home with Home Health    Time spent: 34 minutes  Author: Read Camel, MD 04/08/2024 1:15 PM  For on call review [email or web address].

## 2024-04-08 NOTE — Progress Notes (Signed)
*  PRELIMINARY RESULTS* Echocardiogram 2D Echocardiogram has been performed.  Barry Sampson C Barry Sampson 04/08/2024, 1:25 PM

## 2024-04-08 NOTE — TOC Progression Note (Signed)
 Transition of Care [facility]) - Progression Note    Patient Details  Name: Barry Sampson MRN: [id number] Date of Birth: [birth year]  [facility] CM/SW Contact  Alexandra Ice, RN Phone Number: 04/08/2024, 4:30 PM  Clinical Narrative:    Eldora Greet with Moira Andrews, daughter, over the phone. TOC explained purpose of call and discussed patient's discharge needs. Discussed and explained home health services. She stated she has concerns about his care and wanted him to be transferred to another facility, as she is seeing a decline in patient and thinks this is not being addressed. TOC stated will reach out to MD and notify MD of her concerns.   TOC will continue to follow for discharge needs.   Expected Discharge Plan: Home/Self Care Barriers to Discharge: Continued Medical Work up  Expected Discharge Plan and Services                                               Social Determinants of Health (SDOH) Interventions SDOH Screenings   Food Insecurity: No Food Insecurity (04/07/2024)  Housing: Low Risk  (04/07/2024)  Transportation Needs: No Transportation Needs (04/07/2024)  Utilities: Not At Risk (04/07/2024)  Social Connections: Patient Declined (04/07/2024)  Tobacco Use: High Risk (04/07/2024)    Readmission Risk Interventions     No data to display

## 2024-04-09 ENCOUNTER — Inpatient Hospital Stay

## 2024-04-09 DIAGNOSIS — R413 Other amnesia: Secondary | ICD-10-CM | POA: Diagnosis not present

## 2024-04-09 DIAGNOSIS — G9341 Metabolic encephalopathy: Secondary | ICD-10-CM | POA: Diagnosis not present

## 2024-04-09 DIAGNOSIS — R9089 Other abnormal findings on diagnostic imaging of central nervous system: Secondary | ICD-10-CM

## 2024-04-09 DIAGNOSIS — G0490 Encephalitis and encephalomyelitis, unspecified: Secondary | ICD-10-CM

## 2024-04-09 DIAGNOSIS — R4182 Altered mental status, unspecified: Secondary | ICD-10-CM | POA: Diagnosis not present

## 2024-04-09 LAB — CBC
HCT: 45.3 % (ref 39.0–52.0)
Hemoglobin: 15.5 g/dL (ref 13.0–17.0)
MCH: 31.5 pg (ref 26.0–34.0)
MCHC: 34.2 g/dL (ref 30.0–36.0)
MCV: 92.1 fL (ref 80.0–100.0)
Platelets: 169 10*3/uL (ref 150–400)
RBC: 4.92 MIL/uL (ref 4.22–5.81)
RDW: 12.6 % (ref 11.5–15.5)
WBC: 8.3 10*3/uL (ref 4.0–10.5)
nRBC: 0 % (ref 0.0–0.2)

## 2024-04-09 LAB — C-REACTIVE PROTEIN: CRP: 0.7 mg/dL (ref <1.0)

## 2024-04-09 LAB — BASIC METABOLIC PANEL WITH GFR
Anion gap: 7 (ref 5–15)
BUN: 15 mg/dL (ref 8–23)
CO2: 24 mmol/L (ref 22–32)
Calcium: 9.1 mg/dL (ref 8.9–10.3)
Chloride: 107 mmol/L (ref 98–111)
Creatinine, Ser: 0.92 mg/dL (ref 0.61–1.24)
GFR, Estimated: >60 mL/min (ref >60)
Glucose, Bld: 119 mg/dL — ABNORMAL HIGH (ref 70–99)
Potassium: 3.8 mmol/L (ref 3.5–5.1)
Sodium: 138 mmol/L (ref 135–145)

## 2024-04-09 LAB — MAGNESIUM: Magnesium: 2.1 mg/dL (ref 1.7–2.4)

## 2024-04-09 LAB — PHOSPHORUS: Phosphorus: 2.9 mg/dL (ref 2.5–4.6)

## 2024-04-09 LAB — VITAMIN B12: Vitamin B-12: 560 pg/mL (ref 180–914)

## 2024-04-09 LAB — SEDIMENTATION RATE: Sed Rate: 19 mm/h (ref 0–20)

## 2024-04-09 MED ORDER — THIAMINE MONONITRATE 100 MG PO TABS
100.0000 mg | ORAL_TABLET | Freq: Every day | ORAL | Status: DC
  Administered 2024-04-10 – 2024-04-15 (×6): 100 mg via ORAL
  Filled 2024-04-09 (×6): qty 1

## 2024-04-09 MED ORDER — VITAMIN B-12 1000 MCG PO TABS
1000.0000 ug | ORAL_TABLET | Freq: Every day | ORAL | Status: DC
  Administered 2024-04-10 – 2024-04-15 (×6): 1000 ug via ORAL
  Filled 2024-04-09 (×6): qty 1

## 2024-04-09 MED ORDER — GADOBUTROL 1 MMOL/ML IV SOLN
7.5000 mL | Freq: Once | INTRAVENOUS | Status: AC | PRN
  Administered 2024-04-09: 7.5 mL via INTRAVENOUS

## 2024-04-09 MED ORDER — IOHEXOL 300 MG/ML  SOLN
100.0000 mL | Freq: Once | INTRAMUSCULAR | Status: AC | PRN
Start: 2024-04-09 — End: 2024-04-09
  Administered 2024-04-09: 100 mL via INTRAVENOUS

## 2024-04-09 NOTE — Progress Notes (Signed)
 Patient and daughter introduced to role of nurse navigator. Intake questions completed.   Patient currently lives alone. Has no health insurance and no PCP. Request for Medicaid screening previously sent to financial navigator by TOC.   Patient previously incarcerated for 9 years, released in Oct 2024. Currently on parole. Patient has missed court date while in hospital. Will send note to parole officer and district attorney, as patient has no attorney, per request. [facility] referral completed.  Will continue to follow along. Patient encouraged to reach out with questions/concerns

## 2024-04-09 NOTE — Progress Notes (Signed)
 Dr Meyer Ada notified of patient's refusal to receive Lovenox  injection with no addition order.

## 2024-04-09 NOTE — Progress Notes (Signed)
 Patient's daughter at bedside. Daughter states she is "concerned" that a visitor from yesterday named "Roman" brought drugs to her father. For this reason, patient has been made a confidential encounter with visitation restricted, per family requests.   MD made aware family requesting repeat UDS.

## 2024-04-09 NOTE — Progress Notes (Signed)
 MRI brain personally reviewed  1. Subtle diffusion and FLAIR signal hyperintensity within the medial left hippocampus, without pathologic enhancement. This may be secondary to seizure activity. Infarct is considered less likely. 2. Mildly degraded by patient motion. 3. No acute intracranial abnormality.  CT chest/abdomen/pelvis Negative for malignancy and acute findings  This appears stable from prior. Will hold off on antiseizure medications for now given patient's clinical stability and no clear clinical evidence of seizures; EEG pending tomorrow.   Note made of family concern for visitors potentially bringing drugs of abuse to patient; repeat UDS pending  Barry Levee MD-PhD [facility] [phone] [facility] coverage for [facility] is from 8 AM to 4 AM in-house and 4 PM to 8 PM by telephone/video. 8 PM to 8 AM emergent questions or overnight urgent questions should be addressed to Telene[facility]rology On-call or Barry Sampson neurohospitalist; contact information can be found on [employer]

## 2024-04-09 NOTE — Consult Note (Signed)
 NEUROLOGY CONSULT NOTE   Date of service: April 09, 2024 Patient Name: Barry Sampson MRN:  [id number] DOB:  Mar 10, 1956 Chief Complaint: "Progressive confusion" Requesting Provider: Read Camel, MD  History of Present Illness  Barry Sampson is a 68 y.o. 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 He is accompanied by his daughter family member who provides additional information about his condition.  He has been experiencing acute memory issues and confusion over the past one to two weeks. He has been found wandering in unfamiliar places, such as a [employer] parking lot where he was asleep for several hours in a poorly parked car (in the grass near a ditch rather than in the parking lot), and at a store where he was lost for about five hours.   His family member reports that he has been living alone and managing his affairs independently until recently. There have been no previous incidents of getting lost, financial mismanagement, or unexplained accidents.  The first abnormal incident was him going to family member and trying to give the money that he had already given him previously, as well as being confused about exactly why he had gone to see the family member.  This happened approximately 2 weeks ago and since then there has been a gradual progressive decline with mild fluctuations at times.  He has a history of cocaine substance use, confirmed by a positive urine test, although he denies recent use. He has not been using alcohol, and there is no known history of falls or head injuries. He has had  multiple tick bites recently, and tests for Lyme disease are pending. They are also worried about exposure to a raccoon.  However he has had no systemic symptoms on full review of systems per family at bedside.    His family member notes that he was previously very good at math but is now struggling with simple calculations.  Workup in the hospital has included negative chest x-ray, negative head CT and MRI brain, negative UA, and negative basic labs as documented below  No concern for seizure-like activity and no prior history of seizures  No recent surgeries or significant medical events.    ROS  Limited by patient's mental status, obtained from family as able  Past History   Past Medical History:  Diagnosis Date   COPD (chronic obstructive pulmonary disease) (HCC)    Coronary artery disease     Past Surgical History:  Procedure Laterality Date   CORONARY STENT PLACEMENT      Family History: History reviewed. No pertinent family history.  Social History  reports that he has been smoking cigarettes. He does not have any smokeless tobacco history on file. He reports current drug use. Drug: Marijuana. He reports that he does not drink alcohol.  No Known Allergies  Medications   Current Facility-Administered Medications:    acetaminophen  (TYLENOL ) tablet 650 mg, 650 mg, Oral, Q4H PRN **OR** acetaminophen  (TYLENOL ) 160 MG/5ML solution 650 mg, 650 mg, Per Tube, Q4H PRN **OR** acetaminophen  (TYLENOL ) suppository 650 mg, 650 mg, Rectal, Q4H PRN, Lanetta Pion, MD   atorvastatin  (LIPITOR) tablet 40 mg, 40 mg, Oral, Daily, Althia Atlas, MD,  40 mg at 04/09/24 0935   enoxaparin  (LOVENOX ) injection 40 mg, 40 mg, Subcutaneous, Q24H, Brion Cancel V, MD, 40 mg at 04/08/24 0806   lisinopril (ZESTRIL) tablet 10 mg, 10 mg, Oral, Daily, Agbata, Tochukwu, MD, 10 mg at 04/09/24 0934  Vitals   Vitals:   04/08/24 0725 04/08/24 2019 04/09/24 0352 04/09/24 0830  BP: (!) 150/93 111/63 124/66  134/74  Pulse: 67 (!) 50 75 69  Resp: 16 18 16 18   Temp:  99 F (37.2 C) 98.3 F (36.8 C) 97.6 F (36.4 C)  TempSrc:  Oral    SpO2: 96% 93% 94% 98%  Weight:      Height:        Body mass index is 27.98 kg/m.   Physical Exam   Constitutional: Appears well-developed and well-nourished.  Psych: Affect appropriate to situation, slightly irritable at times but redirectable Eyes: No scleral injection HENT: No oropharyngeal obstruction.  MSK: no major joint deformities.  Cardiovascular: Perfusing extremities well Respiratory: Effort normal, non-labored breathing GI: Soft.  No distension. There is no tenderness.  Skin: Warm dry and intact visible skin, no clear rashes.  Complains of some itchiness of his mid back but there are no lesions visible here and no tenderness to palpation.  Multiple tattoos  Neurologic Examination   Mental Status: Patient is awake, alert, oriented to person, [place] but reports he is in [place] instead of [place].  Unable to answer month/age correctly.  Unable to give any significant history.  Able to name and repeat, no clear neglect.  Very poor attention/concentration.  Requires repetition and demonstration of any complex commands.  Unable to copy complex finger positions.  Recalls 1 out of 3 objects with a 3-minute delay Cranial Nerves: II: Visual Fields are full. Pupils are equal, round, and reactive to light.   III,IV, VI: EOMI without ptosis or diploplia.  V: Facial sensation is symmetric to light touch VII: Facial movement is symmetric.  VIII: hearing is intact to voice X: Uvula elevates symmetrically XI: Shoulder shrug is symmetric. XII: tongue is midline without atrophy or fasciculations.  Motor: Tone is normal. Bulk is normal. 5/5 strength was present in all four extremities.  Sensory: Sensation is symmetric to light touch and temperature in the arms and legs. Cerebellar: FNF and HKS are intact bilaterally Gait:  Able to rise on heels  and toes, slightly slower to rise on the left heel compared to the right but is able to perform this with encouragement.  Slow and slightly shuffling casual gait but steady gait.  Unable to tandem      Labs/Imaging/Neurodiagnostic studies   Other AMS labs:  HIV nonreactive Ammonia 20 CK 25  CBC:  Recent Labs  Lab 04/06/24 1928 04/07/24 0919 04/08/24 0457 04/09/24 0508  WBC 7.5   < > 7.0 8.3  NEUTROABS 3.7  --   --   --   HGB 14.9   < > 14.6 15.5  HCT 44.3   < > 42.9 45.3  MCV 96.3   < > 93.3 92.1  PLT 188   < > 172 169   < > = values in this interval not displayed.   Basic Metabolic Panel:  Recent Labs  Lab 04/06/24 1928 04/07/24 0919 04/08/24 0457 04/09/24 0508  NA 138 137 139 138  K 3.8 4.0 3.7 3.8  CL 104 106 107 107  CO2 25 23 25 24   GLUCOSE 100* 102* 112* 119*  BUN 14 14 15 15   CREATININE 1.07 0.81  0.95 0.92  CALCIUM  9.0 8.7* 8.7* 9.1  MG  --  2.2 2.3 2.1  PHOS  --  2.8 3.3 2.9   Lab Results  Component Value Date   ALT 36 04/06/2024   AST 35 04/06/2024   ALKPHOS 41 04/06/2024   BILITOT 0.8 04/06/2024   Lipid Panel:  Lab Results  Component Value Date   CHOL 221 (H) 04/07/2024   HDL 25 (L) 04/07/2024   LDLCALC 168 (H) 04/07/2024   TRIG 140 04/07/2024   CHOLHDL 8.8 04/07/2024     HgbA1c:  Lab Results  Component Value Date   HGBA1C 5.7 (H) 04/07/2024   Urine Drug Screen:     Component Value Date/Time   LABOPIA NONE DETECTED 04/06/2024 2117   COCAINSCRNUR POSITIVE (A) 04/06/2024 2117   LABBENZ NONE DETECTED 04/06/2024 2117   AMPHETMU NONE DETECTED 04/06/2024 2117   THCU POSITIVE (A) 04/06/2024 2117   LABBARB NONE DETECTED 04/06/2024 2117    Alcohol Level     Component Value Date/Time   [facility] <15 04/06/2024 1928    MRI Brain(Personally reviewed): Per radiology no acute intracranial process.  There is possibly some subtle left hippocampal increased restricted diffusion and FLAIR change on my review although this may be  artifactual  ECHO  1. Left ventricular ejection fraction, by estimation, is 25 to 30%. The  left ventricle has severely decreased function. The left ventricle  demonstrates regional wall motion abnormalities (see scoring  diagram/findings for description). There is mild left   ventricular hypertrophy. Left ventricular diastolic parameters are  consistent with Grade I diastolic dysfunction (impaired relaxation).   2. Right ventricular systolic function is normal. The right ventricular  size is normal.   3. The mitral valve is normal in structure. Trivial mitral valve  regurgitation.   4. The aortic valve is tricuspid. Aortic valve regurgitation is mild.   5. The inferior vena cava is normal in size with greater than 50%  respiratory variability, suggesting right atrial pressure of 3 mmHg.   FINDINGS   Left Ventricle: Left ventricular ejection fraction, by estimation, is 25  to 30%. The left ventricle has severely decreased function. The left  ventricle demonstrates regional wall motion abnormalities. The left  ventricular internal cavity size was normal   in size. There is mild left ventricular hypertrophy. Left ventricular  diastolic parameters are consistent with Grade I diastolic dysfunction  (impaired relaxation).   Neurodiagnostics rEEG:  Pending  ASSESSMENT   LOFTON LEON is a 68 y.o. male presenting with abrupt onset mental status changes that has been gradually progressive over the past 2 weeks per daughter at bedside, relatively stable in the last few days while he has been in the hospital.  Unfortunately he does appear to have relapsed recently with substance use although it is unclear if the substance use led to his current mental status or if his current mental status may have resulted in the relapse.  Given the relatively sudden onset and progression over a short period of time I do think additional inpatient workup is reasonable.  Given lack of diagnostic clarity will  hold off on any empiric treatment for now other than vitamin supplementation  Differential: Substance induced cognitive change Due to abrupt onset would also rule out autoimmune/inflammatory encephalitis with additional workup below Less likely infectious given otherwise reassuring labs and lack of any systemic symptoms  RECOMMENDATIONS  New memory impairment - MRI with and without contrast to evaluate for any inflammatory CNS process - Pending MRI,  lumbar puncture to evaluate for infectious or inflammatory causes (as LP can result in pachymeningeal enhancement). - Routine EEG - B12, MMA, thiamine, RPR, serum paraneoplastic antibody, ESR, CRP - Empiric thiamine 100 mg daily and B12 1000 mcg daily (ordered for tomorrow to allow time for labs to be drawn today) - CT chest abdomen pelvis with contrast for occult malignancy screening - Neurology will follow along  Tick exposure - Lyme disease serological testing pending  Goals of Care / Prognosis Discussed that prognosis is unclear without a diagnosis at this time.  However certainly if he does not improve and no clear etiology can be determined he will benefit from 24/7 supervision due to cognitive impairment and safety concerns. He is not currently safe to live alone or drive. Family is supportive and willing to provide necessary supervision and care.  We discussed that if the workup above continues to be unrevealing we may not come to a definitive diagnosis but may need to favor some substance related insult   ______________________________________________________________________  Baldwin Levee MD-PhD [facility] [phone] [facility] coverage for [facility] is from 8 AM to 4 AM in-house and 4 PM to 8 PM by telephone/video. 8 PM to 8 AM emergent questions or overnight urgent questions should be addressed to Teleneurology On-call or Arlin Benes neurohospitalist; contact information can be found on AMION

## 2024-04-09 NOTE — Progress Notes (Signed)
 Progress Note   Patient: Barry Sampson AOZ:[id number] DOB: 07-08-1956 DOA: 04/06/2024     2 DOS: the patient was seen and examined on 04/09/2024   Brief hospital course:  TAEVON ASCHOFF is a 68 y.o. male with medical history significant for CAD s/p PCI, LBBB, systolic CHF (EF 96-29%), HTN, COPD, tobacco abuse, past history of cocaine abuse, brought in by EMS after he was found wandering.  History in Care Everywhere reviewed.  He was last hospitalized in 2015 for COPD exacerbation and has no regular healthcare based on Care Everywhere review.  At the bedside is patient's daughter and granddaughter who give more information.  They state that patient left home about 9 PM the day prior to pick someone up however he never needed to picking the person up and then today he was found wandering around a store in the clinic at the store who knows the family called them and subsequently called EMS to bring him in.  Daughter at the bedside states that up until a couple weeks ago he was completely his normal self and has no history of memory deficit however over the past 2 weeks they noticed that he was forgetting stuff and on 1 occasion he found his glasses in the freezer.  He fell about 4 days ago and has no history of prior falls.  Patient is presently oriented to self only ED course and data review: BP on arrival 166/92 with otherwise normal vitals. Labs notable mainly for UDS positive for cocaine and cannabinoids.  Other lab work including CBC, BMP, hepatic function, EtOH, ammonia, troponin, UA were all unremarkable EKG, personally viewed and interpreted showing sinus rhythm with LBBB Chest x-ray nonacute Head CT nonacute but showing generalized cerebral atrophy with ventricular dilatation   Hospitalist consulted for admission.          Assessment and Plan:  # Acute encephalopathy (presumed metabolic) Most likely due to Polysubstance abuse-cocaine and cannabinoid causing worsening of underlying  cognitive deficit, Likely multifactorial and related primarily to substance use/abuse CT scan and MRI negative for any acute findings, no stroke. No evidence of acute infection No focal neurologic deficit Drug abuse abstinence counseling done. Remains oriented to person and place and not back to baseline yet per family. At daughter's request, attempts were made to transfer patient to [facility].  Discussed with neurologist who stated that there was no indication for transfer at this time after reviewing patient's imaging.  Notes that patient has a lot of chronic changes on his CT scan and in the setting of acute drug use, not unusual that he would have mental status changes. Recommended to monitor patient closely over the next several days to see if there is any improvement     # Hypoxia, (Acute respiratory failure with hypoxia) Patient became hypoxic to 85% on room air following admission, placed on O2.   Hypoxia resolved, currently patient saturating well on room air D-dimer 0.74, slightly elevated, not very significant.  Patient denies any symptoms, less likely PE. Chest x-ray was nonacute     Tobacco use disorder:  Continue Nicotine patch     HTN (hypertension) Continue lisinopril 10 mg daily    CAD S/P percutaneous coronary angioplasty History of systolic CHF (EF 25 to 30% 2015) LBBB, known No chest pain, shortness of breath EKG is nonacute Continue lisinopril. Hold off on beta-blockers due to history of cocaine use  2D echocardiogram shows an LVEF of 25 to 30% with severely decreased LV function.  Left LV demonstrates regional wall motion abnormalities     Hyperlipidemia, LDL 168, goal <100 Started Lipitor 40 mg p.o. daily.   Follow with PCP to repeat lipid profile after 3 to 6 months and titrate dose of Lipitor accordingly.     COPD (chronic obstructive pulmonary disease)  Not currently exacerbated DuoNebs as needed     Body mass index is 27.98 kg/m.   Interventions:        Subjective: Sitting up in bed.  Has no new complaints.  Physical Exam: Vitals:   04/08/24 0725 04/08/24 2019 04/09/24 0352 04/09/24 0830  BP: (!) 150/93 111/63 124/66 134/74  Pulse: 67 (!) 50 75 69  Resp: 16 18 16 18   Temp:  99 F (37.2 C) 98.3 F (36.8 C) 97.6 F (36.4 C)  TempSrc:  Oral    SpO2: 96% 93% 94% 98%  Weight:      Height:       General: NAD, sitting up in bed. Appear in no distress, affect appropriate Eyes: PERRLA ENT: Oral Mucosa Clear, moist  Neck: no JVD,  Cardiovascular: S1 and S2 Present, no Murmur,  Respiratory: good respiratory effort, Bilateral Air entry equal and Decreased, no Crackles, no wheezes Abdomen: Bowel Sound present, Soft and no tenderness,  Skin: no rashes Extremities: no Pedal edema, no calf tenderness Neurologic: without any new focal findings.  Oriented to person and place not to time     Data Reviewed:  Labs reviewed  Family Communication: Called and spoke to patient's daughter, Moira Andrews and daughter over the phone.  Explained to her the recommendations from [facility].  Neurology consult has been requested and is pending.  She verbalizes understanding and agrees with the plan.  Disposition: Status is: Inpatient Remains inpatient appropriate because: Continues to require stabilization  Planned Discharge Destination: Home    Time spent: 40 minutes  Author: Read Camel, MD 04/09/2024 10:58 AM  For on call review [email or web address].

## 2024-04-09 NOTE — Plan of Care (Signed)
  Problem: Education: Goal: Knowledge of disease or condition will improve Outcome: Not Progressing Goal: Knowledge of secondary prevention will improve (MUST DOCUMENT ALL) Outcome: Not Progressing Goal: Knowledge of patient specific risk factors will improve (DELETE if not current risk factor) Outcome: Not Progressing   Problem: Coping: Goal: Will verbalize positive feelings about self Outcome: Progressing Goal: Will identify appropriate support needs Outcome: Progressing   Problem: Self-Care: Goal: Ability to participate in self-care as condition permits will improve Outcome: Progressing Goal: Verbalization of feelings and concerns over difficulty with self-care will improve Outcome: Progressing Goal: Ability to communicate needs accurately will improve Outcome: Progressing   Problem: Nutrition: Goal: Risk of aspiration will decrease Outcome: Progressing Goal: Dietary intake will improve Outcome: Progressing

## 2024-04-10 ENCOUNTER — Inpatient Hospital Stay

## 2024-04-10 DIAGNOSIS — G9341 Metabolic encephalopathy: Secondary | ICD-10-CM | POA: Diagnosis not present

## 2024-04-10 DIAGNOSIS — R569 Unspecified convulsions: Secondary | ICD-10-CM

## 2024-04-10 LAB — CSF CELL COUNT WITH DIFFERENTIAL
Eosinophils, CSF: 0 %
Lymphs, CSF: 72 %
Monocyte-Macrophage-Spinal Fluid: 14 %
RBC Count, CSF: 0 /mm3 (ref 0–3)
Segmented Neutrophils-CSF: 14 %
Tube #: 3
WBC, CSF: 8 /mm3 — ABNORMAL HIGH (ref 0–5)

## 2024-04-10 LAB — URINE DRUG SCREEN, QUALITATIVE (ARMC ONLY)
Amphetamines, Ur Screen: NOT DETECTED
Barbiturates, Ur Screen: NOT DETECTED
Benzodiazepine, Ur Scrn: NOT DETECTED
Cannabinoid 50 Ng, Ur ~~LOC~~: POSITIVE — AB
Cocaine Metabolite,Ur ~~LOC~~: NOT DETECTED
MDMA (Ecstasy)Ur Screen: NOT DETECTED
Methadone Scn, Ur: NOT DETECTED
Opiate, Ur Screen: NOT DETECTED
Phencyclidine (PCP) Ur S: NOT DETECTED
Tricyclic, Ur Screen: NOT DETECTED

## 2024-04-10 LAB — CBC
HCT: 41.8 % (ref 39.0–52.0)
Hemoglobin: 14.6 g/dL (ref 13.0–17.0)
MCH: 32.5 pg (ref 26.0–34.0)
MCHC: 34.9 g/dL (ref 30.0–36.0)
MCV: 93.1 fL (ref 80.0–100.0)
Platelets: 164 10*3/uL (ref 150–400)
RBC: 4.49 MIL/uL (ref 4.22–5.81)
RDW: 12.7 % (ref 11.5–15.5)
WBC: 8.2 10*3/uL (ref 4.0–10.5)
nRBC: 0 % (ref 0.0–0.2)

## 2024-04-10 LAB — BASIC METABOLIC PANEL WITH GFR
Anion gap: 4 — ABNORMAL LOW (ref 5–15)
BUN: 19 mg/dL (ref 8–23)
CO2: 26 mmol/L (ref 22–32)
Calcium: 8.8 mg/dL — ABNORMAL LOW (ref 8.9–10.3)
Chloride: 108 mmol/L (ref 98–111)
Creatinine, Ser: 1.04 mg/dL (ref 0.61–1.24)
GFR, Estimated: >60 mL/min (ref >60)
Glucose, Bld: 124 mg/dL — ABNORMAL HIGH (ref 70–99)
Potassium: 3.6 mmol/L (ref 3.5–5.1)
Sodium: 138 mmol/L (ref 135–145)

## 2024-04-10 LAB — PROTEIN AND GLUCOSE, CSF
Glucose, CSF: 62 mg/dL (ref 40–70)
Total  Protein, CSF: 49 mg/dL — ABNORMAL HIGH (ref 15–45)

## 2024-04-10 LAB — MAGNESIUM: Magnesium: 2.2 mg/dL (ref 1.7–2.4)

## 2024-04-10 LAB — RPR: RPR Ser Ql: NONREACTIVE

## 2024-04-10 LAB — PHOSPHORUS: Phosphorus: 4 mg/dL (ref 2.5–4.6)

## 2024-04-10 MED ORDER — LIDOCAINE 1 % OPTIME INJ - NO CHARGE
5.0000 mL | Freq: Once | INTRAMUSCULAR | Status: AC
  Administered 2024-04-10: 2.5 mL
  Filled 2024-04-10: qty 6

## 2024-04-10 NOTE — Procedures (Signed)
 History: 68 yo M being evaluated for confusion, concern for seizures.   EEG Duration: 23 minutes  Sedation: none  Patient State: Awake and drowsy  Technique: This EEG was acquired with electrodes placed according to the International 10-20 electrode system (including Fp1, Fp2, F3, F4, C3, C4, P3, P4, O1, O2, T3, T4, T5, T6, A1, A2, Fz, Cz, Pz). The following electrodes were missing or displaced: none.   Background: There is a posterior dominant rhythm of 7 to 8 Hz which is relatively well-sustained.  In addition there is diffuse generalized irregular theta and delta activity intruding into the background throughout the study.  There were no epileptiform discharges seen throughout the study.  Photic stimulation: Physiologic driving is not performed  EEG Abnormalities: 1) slow posterior dominant rhythm 2) generalized irregular slow activity  Clinical Interpretation: This EEG is consistent with a generalized nonspecific cerebral dysfunction (encephalopathy). Please note that lack of epileptiform activity on EEG does not preclude the possibility of epilepsy.   Ann Keto, MD [facility]   If 7pm- 7am, please page neurology on call as listed in AMION.

## 2024-04-10 NOTE — Procedures (Signed)
 PROCEDURE SUMMARY:  Successful fluoroscopic guided lumbar puncture at the level of L2-3.  Opening pressure was 14 cm H2O ~14 mL clear colorless fluid collected and sent for labs.  No immediate complications.  Pt tolerated well.   EBL = none  Please see full dictation in imaging section of Epic for procedure details.   Electronically Signed: Blondell Laperle M Danniella Robben, PA-C 04/10/2024, 3:34 PM

## 2024-04-10 NOTE — Progress Notes (Signed)
 Progress Note   Patient: Barry Sampson ZOX:[id number] DOB: April 15, 1956 DOA: 04/06/2024     3 DOS: the patient was seen and examined on 04/10/2024   Brief hospital course:  Barry Sampson is a 68 y.o. male with medical history significant for CAD s/p PCI, LBBB, systolic CHF (EF 81-19%), HTN, COPD, tobacco abuse, past history of cocaine abuse, brought in by EMS after he was found wandering.  History in Care Everywhere reviewed.  He was last hospitalized in 2015 for COPD exacerbation and has no regular healthcare based on Care Everywhere review.  At the bedside is patient's daughter and granddaughter who give more information.  They state that patient left home about 9 PM the day prior to pick someone up however he never needed to picking the person up and then today he was found wandering around a store in the clinic at the store who knows the family called them and subsequently called EMS to bring him in.  Daughter at the bedside states that up until a couple weeks ago he was completely his normal self and has no history of memory deficit however over the past 2 weeks they noticed that he was forgetting stuff and on 1 occasion he found his glasses in the freezer.  He fell about 4 days ago and has no history of prior falls.  Patient is presently oriented to self only ED course and data review: BP on arrival 166/92 with otherwise normal vitals. Labs notable mainly for UDS positive for cocaine and cannabinoids.  Other lab work including CBC, BMP, hepatic function, EtOH, ammonia, troponin, UA were all unremarkable EKG, personally viewed and interpreted showing sinus rhythm with LBBB Chest x-ray nonacute Head CT nonacute but showing generalized cerebral atrophy with ventricular dilatation   Hospitalist consulted for admission.    Assessment and Plan:  # Acute encephalopathy (presumed metabolic) Most likely due to Polysubstance abuse-cocaine and cannabinoid causing worsening of underlying cognitive  deficit, Likely multifactorial and related primarily to substance use/abuse CT scan and MRI negative for any acute findings, no stroke. No evidence of acute infection No focal neurologic deficit Drug abuse abstinence counseling done. Remains oriented to person and place and not back to baseline yet per family. At daughter's request, attempts were made to transfer patient to [facility].  Discussed with neurologist who stated that there was no indication for transfer at this time after reviewing patient's imaging.  Notes that patient has a lot of chronic changes on his CT scan and in the setting of acute drug use, not unusual that he would have mental status changes. Recommended to monitor patient closely over the next several days to see if there is any improvement Appreciate neurology input. MRI of the brain with and without contrast shows subtle diffusion and FLAIR signal hyperintensity within the medial left hippocampus, without pathologic enhancement. This may be secondary to seizure activity. Infarct is considered less likely. EEG pending Patient is scheduled for LP today. Results Pending.  Continue empiric therapy with thiamine and B12 CT chest/abdomen and pelvis is negative for occult malignancy    # Hypoxia, (Acute respiratory failure with hypoxia) Patient became hypoxic to 85% on room air following admission, placed on O2.   Hypoxia resolved, currently patient saturating well on room air D-dimer 0.74, slightly elevated, not very significant.  Patient denies any symptoms, less likely PE. Chest x-ray was nonacute     Tobacco use disorder:  Continue Nicotine patch     HTN (hypertension) Continue lisinopril  10 mg daily     CAD S/P percutaneous coronary angioplasty History of systolic CHF (EF 25 to 30% 2015) LBBB, known No chest pain, shortness of breath EKG is nonacute Continue lisinopril. Hold off on beta-blockers due to history of cocaine use  2D echocardiogram shows  an LVEF of 25 to 30% with severely decreased LV function.  Left LV demonstrates regional wall motion abnormalities     Hyperlipidemia, LDL 168, goal <100 Started Lipitor 40 mg p.o. daily.   Follow with PCP to repeat lipid profile after 3 to 6 months and titrate dose of Lipitor accordingly.     COPD (chronic obstructive pulmonary disease)  Not currently exacerbated DuoNebs as needed        Subjective: Sitting in bed. Remains confused and oriented only to person and place  Physical Exam: Vitals:   04/09/24 0830 04/09/24 1551 04/09/24 1948 04/10/24 0822  BP: 134/74 122/71 121/88 136/88  Pulse: 69 76 74 75  Resp: 18 18 18 16   Temp: 97.6 F (36.4 C) 98.6 F (37 C) 98.4 F (36.9 C) 97.9 F (36.6 C)  TempSrc:  Oral Oral   SpO2: 98% 97% 97% 97%  Weight:      Height:       General: NAD, sitting up in bed. Appear in no distress, affect appropriate Eyes: PERRLA ENT: Oral Mucosa Clear, moist  Neck: no JVD,  Cardiovascular: S1 and S2 Present, no Murmur,  Respiratory: good respiratory effort, Bilateral Air entry equal and Decreased, no Crackles, no wheezes Abdomen: Bowel Sound present, Soft and no tenderness,  Skin: no rashes Extremities: no Pedal edema, no calf tenderness Neurologic: without any new focal findings.  Oriented to person and place not to time      Data Reviewed:  Labs reviewed  Family Communication: None  Disposition: Status is: Inpatient Remains inpatient appropriate because:   Planned Discharge Destination: TBD    Time spent: 38 minutes  Author: Read Camel, MD 04/10/2024 1:08 PM  For on call review [email or web address].

## 2024-04-10 NOTE — Progress Notes (Signed)
 Occupational Therapy Treatment Patient Details Name: Barry Sampson MRN: [id number] DOB: June 04, 1956 Today's Date: 04/10/2024   History of present illness Pt is a 68 y.o. male brought in by EMS after he was found wandering. Last hospitalized in 2015 for COPD exacerbation and has no regular healthcare. Daughter states that up until a couple weeks ago he was his normal self with no history of memory deficit, however over the past 2 weeks they noticed that he was forgetting stuff and on 1 occasion he found his glasses in the freezer. Fall ~4 days ago, presently oriented to self only. MRI negative. PMH of CAD s/p PCI, LBBB, systolic CHF (EF 40-98%), HTN, COPD, tobacco abuse, past history of cocaine abuse.   OT comments  Upon entering the room, pt supine in bed and agreeable to OT intervention. Transport also arriving shortly after. Pt performing bed mobility with supervision and ambulating to bathroom as staff needed urine sample. Pt needing increased time and cues for sequencing novel tasks in bathroom. Supervision for standing balance while managing urinal and clothing. Pt stands at sink for hand hygiene with mod multimodal cues to sequence and initiate steps. Pt returning to bed for transport to take him to procedure/test.       If plan is discharge home, recommend the following:  A little help with walking and/or transfers;Supervision due to cognitive status;A little help with bathing/dressing/bathroom   Equipment Recommendations  None recommended by OT       Precautions / Restrictions Precautions Precautions: Fall Recall of Precautions/Restrictions: Impaired       Mobility Bed Mobility Overal bed mobility: Modified Independent                  Transfers Overall transfer level: Needs assistance Equipment used: None Transfers: Sit to/from Stand Sit to Stand: Supervision                 Balance Overall balance assessment: Needs assistance Sitting-balance support: Feet  supported Sitting balance-Leahy Scale: Good     Standing balance support: During functional activity Standing balance-Leahy Scale: Fair                             ADL either performed or assessed with clinical judgement   ADL Overall ADL's : Needs assistance/impaired                         Toilet Transfer: Supervision/safety   Toileting- Clothing Manipulation and Hygiene: Supervision/safety              Extremity/Trunk Assessment Upper Extremity Assessment Upper Extremity Assessment: Overall WFL for tasks assessed   Lower Extremity Assessment Lower Extremity Assessment: Overall WFL for tasks assessed        Vision Patient Visual Report: No change from baseline           Communication Communication Communication: No apparent difficulties Factors Affecting Communication: Hearing impaired   Cognition Arousal: Alert Behavior During Therapy: WFL for tasks assessed/performed Cognition: Cognition impaired   Orientation impairments: Place, Time, Situation Awareness: Online awareness impaired Memory impairment (select all impairments): Short-term memory Attention impairment (select first level of impairment): Sustained attention, Selective attention, Alternating attention, Divided attention Executive functioning impairment (select all impairments): Initiation, Organization, Sequencing, Reasoning, Problem solving                   Following commands: Impaired Following commands impaired: Follows multi-step commands inconsistently, Follows  one step commands with increased time      Cueing   Cueing Techniques: Verbal cues, Tactile cues, Visual cues             Pertinent Vitals/ Pain       Pain Assessment Pain Assessment: Faces Faces Pain Scale: No hurt  Home Living Family/patient expects to be discharged to:: Private residence                                            Frequency  Min 1X/week         Progress Toward Goals  OT Goals(current goals can now be found in the care plan section)  Progress towards OT goals: Progressing toward goals      AM-PAC OT "6 Clicks" Daily Activity     Outcome Measure   Help from another person eating meals?: None Help from another person taking care of personal grooming?: None Help from another person toileting, which includes using toliet, bedpan, or urinal?: A Little Help from another person bathing (including washing, rinsing, drying)?: A Little Help from another person to put on and taking off regular upper body clothing?: None Help from another person to put on and taking off regular lower body clothing?: None 6 Click Score: 22    End of Session    OT Visit Diagnosis: Other abnormalities of gait and mobility (R26.89)   Activity Tolerance Patient tolerated treatment well   Patient Left with call bell/phone within reach;with family/visitor present;in bed;Other (comment) (transport)   Nurse Communication Mobility status        Time: 1014-1030 OT Time Calculation (min): 16 min  Charges: OT General Charges $OT Visit: 1 Visit OT Treatments $Self Care/Home Management : 8-22 mins  George Kinder, MS, OTR/L , CBIS ascom [phone]  04/10/24, 3:11 PM

## 2024-04-10 NOTE — Progress Notes (Signed)
 Physical Therapy Treatment Patient Details Name: Barry Sampson MRN: [id number] DOB: [birth year] Today's Date: 04/10/2024   History of Present Illness Pt is a 68 y.o. male brought in by EMS after he was found wandering. Last hospitalized in 2015 for COPD exacerbation and has no regular healthcare. Daughter states that up until a couple weeks ago he was his normal self with no history of memory deficit, however over the past 2 weeks they noticed that he was forgetting stuff and on 1 occasion he found his glasses in the freezer. Fall ~4 days ago, presently oriented to self only. MRI negative. PMH of CAD s/p PCI, LBBB, systolic CHF (EF 40-98%), HTN, COPD, tobacco abuse, past history of cocaine abuse.    PT Comments  Pt tolerated treatment well today, with treatment focusing on improving overall standing balance and proximal stability for improved quality of gait and decreased risk of falls. Pt continues to be low level assist for mobility but is provided CGA for safety with dual tasks during ambulation. Increased unsteadiness noted with dual tasks during gait which increase his risk of falls, especially in the community setting, as he is highly distractible. Pt with improved quality of gait and balance with cues for faster cadence and bigger step length/foot clearance, but required constant cueing for sequencing secondary to decreased attention to task with external stimuli.  Pt participates in multiple seated and standing balance activities including multidirectional reaching, perturbations in standing, and PNF thrust/reverse thrust. Pt demonstrates improvements in proximal stability and balance reaction strategies during these interventions with repetition, indicating good carry over and prognosis for continued therapy services in the acute and post acute setting. Will continue per POC.    If plan is discharge home, recommend the following: Assist for transportation;Help with stairs or ramp for entrance    Can travel by private vehicle        Equipment Recommendations  None recommended by PT    Recommendations for Other Services       Precautions / Restrictions Precautions Precautions: Fall Recall of Precautions/Restrictions: Impaired Restrictions Weight Bearing Restrictions Per Provider Order: No     Mobility  Bed Mobility               General bed mobility comments: seated EOB upon entry    Transfers Overall transfer level: Modified independent                 General transfer comment: mod I to perform multiple STS from EOB without AD    Ambulation/Gait Ambulation/Gait assistance: Contact guard assist Gait Distance (Feet): 320 Feet (141ft x2) Assistive device: None         General Gait Details: CGA for safety to ambulate in hallway without AD, with focus on dual task interventions including conversation, multidirectional head turns, and changes in gait speed. Increased veering, discontinuous steps, decreased step length/foot clearance, and intermittent scissoring gait pattern noted during dual tasks. 2nd bout of gait with focus on exaggerated step length and faster cadence. Improved quality of gait and overall balance noted with this technique, however, he required multimodal cues for attention to task as he is highly distractible.     Balance Overall balance assessment: Needs assistance Sitting-balance support: Feet supported Sitting balance-Leahy Scale: Normal Sitting balance - Comments: participates in 2x10 of PNF thrust/reverse thrust, demonstrating improved proximal stability with both directions, with repetition     Standing balance-Leahy Scale: Good Standing balance comment: Performs of multidirectional reaching with each UE, with narrow BOS; performs  of romberg stance with perturbations demonstrating increased ankle righting and stepping reactive strategies for maintenance of balance. Attempted picking up object from floor and chair  height but unable to facilitate secondary to increased c/o back pain.                            Communication Communication Communication: No apparent difficulties  Cognition Arousal: Alert Behavior During Therapy: WFL for tasks assessed/performed                           PT - Cognition Comments: easily distractible requiring frequent cues for redirection Following commands: Impaired Following commands impaired: Follows multi-step commands inconsistently, Follows one step commands with increased time    Cueing Cueing Techniques: Verbal cues, Tactile cues, Visual cues  Exercises Other Exercises Other Exercises: Pt educated re: PT role/POC, interventions as they relate to improved overall balance, decreased risk of falls, and improved ambulation for independence. He verbalized understanding.    General Comments        Pertinent Vitals/Pain Pain Assessment Pain Assessment: Faces Faces Pain Scale: Hurts a little bit Pain Location: low back pain with attempted bending to pick up object from floor/chair height Pain Descriptors / Indicators: Aching Pain Intervention(s): Limited activity within patient's tolerance, Repositioned     PT Goals (current goals can now be found in the care plan section) Progress towards PT goals: Progressing toward goals    Frequency    Min 2X/week       AM-PAC PT "6 Clicks" Mobility   Outcome Measure  Help needed turning from your back to your side while in a flat bed without using bedrails?: None Help needed moving from lying on your back to sitting on the side of a flat bed without using bedrails?: None Help needed moving to and from a bed to a chair (including a wheelchair)?: A Little Help needed standing up from a chair using your arms (e.g., wheelchair or bedside chair)?: A Little Help needed to walk in hospital room?: A Little Help needed climbing 3-5 steps with a railing? : A Little 6 Click Score: 20    End of  Session Equipment Utilized During Treatment: Gait belt Activity Tolerance: Patient tolerated treatment well Patient left: in bed;with call bell/phone within reach (no bed alarm warranted as pt is safe for ambulation to/from bed and recliner) Nurse Communication: Mobility status PT Visit Diagnosis: Unsteadiness on feet (R26.81);Difficulty in walking, not elsewhere classified (R26.2);Muscle weakness (generalized) (M62.81)     Time: 8119-1478 PT Time Calculation (min) (ACUTE ONLY): 25 min  Charges:    $Neuromuscular Re-education: 23-37 mins PT General Charges $$ ACUTE PT VISIT: 1 Visit                      Branda Cain, PT, DPT 9:30 AM,04/10/24 Physical Therapist - [facility]

## 2024-04-10 NOTE — Progress Notes (Signed)
 Eeg done

## 2024-04-10 NOTE — Care Management Important Message (Signed)
 Important Message  Patient Details  Name: Barry Sampson MRN: [id number] Date of Birth: [birth year]   Important Message Given:  Yes - Medicare IM     Anise Kerns 04/10/2024, 2:49 PM

## 2024-04-11 DIAGNOSIS — R839 Unspecified abnormal finding in cerebrospinal fluid: Secondary | ICD-10-CM

## 2024-04-11 DIAGNOSIS — G9341 Metabolic encephalopathy: Secondary | ICD-10-CM | POA: Diagnosis not present

## 2024-04-11 DIAGNOSIS — R9089 Other abnormal findings on diagnostic imaging of central nervous system: Secondary | ICD-10-CM | POA: Diagnosis not present

## 2024-04-11 LAB — BASIC METABOLIC PANEL WITH GFR
Anion gap: 6 (ref 5–15)
BUN: 18 mg/dL (ref 8–23)
CO2: 28 mmol/L (ref 22–32)
Calcium: 9 mg/dL (ref 8.9–10.3)
Chloride: 105 mmol/L (ref 98–111)
Creatinine, Ser: 1.04 mg/dL (ref 0.61–1.24)
GFR, Estimated: >60 mL/min (ref >60)
Glucose, Bld: 102 mg/dL — ABNORMAL HIGH (ref 70–99)
Potassium: 3.8 mmol/L (ref 3.5–5.1)
Sodium: 139 mmol/L (ref 135–145)

## 2024-04-11 LAB — MENINGITIS/ENCEPHALITIS PANEL (CSF)

## 2024-04-11 LAB — CBC
HCT: 43.3 % (ref 39.0–52.0)
Hemoglobin: 14.7 g/dL (ref 13.0–17.0)
MCH: 31.8 pg (ref 26.0–34.0)
MCHC: 33.9 g/dL (ref 30.0–36.0)
MCV: 93.7 fL (ref 80.0–100.0)
Platelets: 155 10*3/uL (ref 150–400)
RBC: 4.62 MIL/uL (ref 4.22–5.81)
RDW: 12.8 % (ref 11.5–15.5)
WBC: 7.7 10*3/uL (ref 4.0–10.5)
nRBC: 0 % (ref 0.0–0.2)

## 2024-04-11 MED ORDER — POLYETHYLENE GLYCOL 3350 17 G PO PACK
17.0000 g | PACK | Freq: Every day | ORAL | Status: DC
  Administered 2024-04-11 – 2024-04-15 (×5): 17 g via ORAL
  Filled 2024-04-11 (×5): qty 1

## 2024-04-11 MED ORDER — SODIUM CHLORIDE 0.9 % IV SOLN
1000.0000 mg | INTRAVENOUS | Status: AC
  Administered 2024-04-11 – 2024-04-15 (×5): 1000 mg via INTRAVENOUS
  Filled 2024-04-11 (×5): qty 16

## 2024-04-11 MED ORDER — PANTOPRAZOLE SODIUM 40 MG PO TBEC
40.0000 mg | DELAYED_RELEASE_TABLET | Freq: Every day | ORAL | Status: DC
  Administered 2024-04-11 – 2024-04-15 (×5): 40 mg via ORAL
  Filled 2024-04-11 (×5): qty 1

## 2024-04-11 NOTE — Progress Notes (Signed)
  Progress Note   Date: 04/11/2024  Patient Name: Barry Sampson        MRN#: [id number]  Clarification of diagnosis:  Chronic systolic CHF has been documented is clinically supported and the clinical data/criteria used for diagnosing is ______.

## 2024-04-11 NOTE — Progress Notes (Signed)
 Occupational Therapy Treatment Patient Details Name: Barry Sampson MRN: [id number] DOB: [birth year] Today's Date: 04/11/2024   History of present illness Pt is a 68 y.o. male brought in by EMS after he was found wandering. Last hospitalized in 2015 for COPD exacerbation and has no regular healthcare. Daughter states that up until a couple weeks ago he was his normal self with no history of memory deficit, however over the past 2 weeks they noticed that he was forgetting stuff and on 1 occasion he found his glasses in the freezer. Fall ~4 days ago, presently oriented to self only. MRI negative. PMH of CAD s/p PCI, LBBB, systolic CHF (EF 65-78%), HTN, COPD, tobacco abuse, past history of cocaine abuse.   OT comments  Upon entering the room, pt supine in bed and agreeable with OT intervention. Session focused on cognition and given SLUMS. Room with minimal distractions.   Pt completed SLUMS examination this date scoring 1/30 indicating a positive screen for dementia. Of note, it is not within occupational therapy scope of practice to diagnose cognitive impairments, this screen indicates need for further testing. The SLUMS is a 30 point, 11 question screening questionnaire that tests orientation, memory, attention, and executive function. Pt with noted impairments in short term memory, problem solving, and executive function limiting ability to perform tasks such as medication management, financial management, and any other high level of skills. Safety concern as well since pt may not be able to react during an emergency situation. OT reviewed results with daughter in room.       If plan is discharge home, recommend the following:  Supervision due to cognitive status;Direct supervision/assist for medications management;Direct supervision/assist for financial management;Assist for transportation;Assistance with cooking/housework   Equipment Recommendations  None recommended by OT       Precautions  / Restrictions Precautions Precautions: Fall       Mobility Bed Mobility Overal bed mobility: Modified Independent                  Transfers                             ADL either performed or assessed with clinical judgement    Extremity/Trunk Assessment Upper Extremity Assessment Upper Extremity Assessment: Generalized weakness   Lower Extremity Assessment Lower Extremity Assessment: Generalized weakness        Vision Patient Visual Report: No change from baseline           Communication Communication Communication: No apparent difficulties Factors Affecting Communication: Hearing impaired   Cognition Arousal: Alert Behavior During Therapy: Flat affect Cognition: Cognition impaired   Orientation impairments: Place, Time, Situation Awareness: Online awareness impaired, Intellectual awareness impaired Memory impairment (select all impairments): Short-term memory, Working memory Attention impairment (select first level of impairment): Focused attention Executive functioning impairment (select all impairments): Initiation, Organization, Sequencing, Reasoning, Problem solving                   Following commands: Impaired Following commands impaired: Follows multi-step commands inconsistently, Follows one step commands with increased time      Cueing   Cueing Techniques: Verbal cues, Tactile cues, Visual cues             Pertinent Vitals/ Pain       Pain Assessment Pain Assessment: No/denies pain         Frequency  Min 1X/week        Progress Toward Goals  OT Goals(current goals can now be found in the care plan section)  Progress towards OT goals: Progressing toward goals      AM-PAC OT "6 Clicks" Daily Activity     Outcome Measure   Help from another person eating meals?: None Help from another person taking care of personal grooming?: None Help from another person toileting, which includes using toliet,  bedpan, or urinal?: A Little Help from another person bathing (including washing, rinsing, drying)?: A Little Help from another person to put on and taking off regular upper body clothing?: None Help from another person to put on and taking off regular lower body clothing?: None 6 Click Score: 22    End of Session    OT Visit Diagnosis: Other abnormalities of gait and mobility (R26.89)   Activity Tolerance Patient tolerated treatment well   Patient Left with call bell/phone within reach;with family/visitor present;in bed   Nurse Communication Mobility status        Time: 1610-9604 OT Time Calculation (min): 24 min  Charges: OT General Charges $OT Visit: 1 Visit OT Treatments $Therapeutic Activity: 23-37 mins  George Kinder, MS, OTR/L , CBIS ascom [phone]  04/11/24, 3:20 PM

## 2024-04-11 NOTE — TOC Progression Note (Deleted)
 Transition of Care [facility]) - Progression Note    Patient Details  Name: Barry Sampson MRN: [id number] Date of Birth: 1956/04/23  Transition of Care [facility]) CM/SW Contact  Mirian Ames Doloris Freund, RN Phone Number: 04/11/2024, 3:47 PM  Clinical Narrative:    [facility] Team will continue to follow patient for appropriate disposition.    Expected Discharge Plan: Home/Self Care Barriers to Discharge: Continued Medical Work up  Expected Discharge Plan and Services                                               Social Determinants of Health (SDOH) Interventions SDOH Screenings   Food Insecurity: No Food Insecurity (04/07/2024)  Housing: Low Risk  (04/07/2024)  Transportation Needs: No Transportation Needs (04/07/2024)  Utilities: Not At Risk (04/07/2024)  Social Connections: Patient Declined (04/07/2024)  Tobacco Use: High Risk (04/07/2024)    Readmission Risk Interventions     No data to display

## 2024-04-11 NOTE — Progress Notes (Signed)
 Progress Note   Patient: Barry Sampson:[id number] DOB: [birth year] DOA: 04/06/2024     4 DOS: the patient was seen and examined on 04/11/2024   Brief hospital course:  ROALD LUKACS is a 68 y.o. male with medical history significant for CAD s/p PCI, LBBB, systolic CHF (EF 95-62%), HTN, COPD, tobacco abuse, past history of cocaine abuse, brought in by EMS after he was found wandering.  History in Care Everywhere reviewed.  He was last hospitalized in 2015 for COPD exacerbation and has no regular healthcare based on Care Everywhere review.  At the bedside is patient's daughter and granddaughter who give more information.  They state that patient left home about 9 PM the day prior to pick someone up however he never needed to picking the person up and then today he was found wandering around a store in the clinic at the store who knows the family called them and subsequently called EMS to bring him in.  Daughter at the bedside states that up until a couple weeks ago he was completely his normal self and has no history of memory deficit however over the past 2 weeks they noticed that he was forgetting stuff and on 1 occasion he found his glasses in the freezer.  He fell about 4 days ago and has no history of prior falls.  Patient is presently oriented to self only ED course and data review: BP on arrival 166/92 with otherwise normal vitals. Labs notable mainly for UDS positive for cocaine and cannabinoids.  Other lab work including CBC, BMP, hepatic function, EtOH, ammonia, troponin, UA were all unremarkable EKG, personally viewed and interpreted showing sinus rhythm with LBBB Chest x-ray nonacute Head CT nonacute but showing generalized cerebral atrophy with ventricular dilatation   Hospitalist consulted for admission.     Assessment and Plan:  # Acute encephalopathy (presumed metabolic) Most likely due to Polysubstance abuse-cocaine and cannabinoid causing worsening of underlying cognitive  deficit, Likely multifactorial and related primarily to substance use/abuse CT scan and MRI negative for any acute findings, no stroke. No evidence of acute infection No focal neurologic deficit Drug abuse abstinence counseling done. Remains oriented to person and place and not back to baseline yet per family. At daughter's request, attempts were made to transfer patient to [facility].  Discussed with neurologist who stated that there was no indication for transfer at this time after reviewing patient's imaging.  Notes that patient has a lot of chronic changes on his CT scan and in the setting of acute drug use, not unusual that he would have mental status changes. Recommended to monitor patient closely over the next several days to see if there is any improvement MRI of the brain with and without contrast shows subtle diffusion and FLAIR signal hyperintensity within the medial left hippocampus, without pathologic enhancement. This may be secondary to seizure activity. Infarct is considered less likely. EEG is consistent with a generalized nonspecific cerebral dysfunction (encephalopathy).  Patient is s/p LP.  CSF had normal glucose with slightly elevated protein.  Meningitis/encephalitis panel is negative.  No organisms seen.  CSF culture pending Continue empiric therapy with thiamine and B12 CT chest/abdomen and pelvis is negative for occult malignancy Discussed with neurology who recommends high-dose steroids for 5 days due to suspicion for an autoimmune etiology.     # Hypoxia, (Acute respiratory failure with hypoxia) Patient became hypoxic to 85% on room air following admission, placed on O2.   Hypoxia resolved, currently patient saturating  well on room air D-dimer 0.74, slightly elevated, not very significant.  Patient denies any symptoms, less likely PE. Chest x-ray was nonacute     Tobacco use disorder:  Continue Nicotine patch     HTN (hypertension) Continue lisinopril 10 mg  daily     CAD S/P percutaneous coronary angioplasty History of systolic CHF (EF 25 to 30% 2015) LBBB, known No chest pain, shortness of breath EKG is nonacute Continue lisinopril. Hold off on beta-blockers due to history of cocaine use  2D echocardiogram shows an LVEF of 25 to 30% with severely decreased LV function.  Left LV demonstrates regional wall motion abnormalities     Hyperlipidemia, LDL 168, goal <100 Continue Lipitor 40 mg p.o. daily.   Follow with PCP to repeat lipid profile after 3 to 6 months and titrate dose of Lipitor accordingly.     COPD (chronic obstructive pulmonary disease)  Not currently exacerbated DuoNebs as needed         Subjective: Patient is seen and examined at the bedside.  Family is at the bedside.  He is oriented only to person not to place or time  Physical Exam: Vitals:   04/10/24 1610 04/10/24 2047 04/11/24 0425 04/11/24 0723  BP: 137/78 127/74 119/84 125/83  Pulse: 79 70 70 73  Resp: 18 20  15   Temp: 98.1 F (36.7 C) 98.2 F (36.8 C) (!) 97.4 F (36.3 C) 97.9 F (36.6 C)  TempSrc:      SpO2: 96% 93% 98% 99%  Weight:      Height:       General: NAD, sitting up in bed.  Family at the bedside Appear in no distress, affect appropriate Eyes: PERRLA ENT: Oral Mucosa Clear, moist  Neck: no JVD,  Cardiovascular: S1 and S2 Present, no Murmur,  Respiratory: good respiratory effort, Bilateral Air entry equal and Decreased, no Crackles, no wheezes Abdomen: Bowel Sound present, Soft and no tenderness,  Skin: no rashes Extremities: no Pedal edema, no calf tenderness Neurologic: without any new focal findings.  Oriented to person and place not to time    Data Reviewed: Meningitis/encephalitis panel is negative, CSF protein 49 Labs reviewed  Family Communication: Plan of care discussed with patient's daughter at the bedside.  All questions and concerns have been addressed.  They verbalized understanding and agree with the  plan.  Disposition: Status is: Inpatient Remains inpatient appropriate because: Requires high-dose steroids  Planned Discharge Destination: TBD    Time spent: 40 minutes  Author: Read Camel, MD 04/11/2024 8:17 AM  For on call review [email or web address].

## 2024-04-11 NOTE — Progress Notes (Signed)
 Mobility Specialist - Progress Note     04/11/24 1138  Mobility  Activity Ambulated independently in hallway  Level of Assistance Independent after set-up  Assistive Device None  Distance Ambulated (ft) 160 ft  Range of Motion/Exercises Active  Activity Response Tolerated well  Mobility Referral Yes  Mobility visit 1 Mobility  Mobility Specialist Start Time (ACUTE ONLY) 1108  Mobility Specialist Stop Time (ACUTE ONLY) 1130  Mobility Specialist Time Calculation (min) (ACUTE ONLY) 22 min   Pt resting in bed on RA upon entry. Pt STS and ambulates to hallway around NS with no AD. Pt required redirecting as he would get easily distracted but objects and noises. Pt given directional cuing to stay on task as well. Pt returned to recliner and left with needs in reach. Chair alarm activated.  Jerri Morale Mobility Specialist 04/11/24, 11:46 AM

## 2024-04-11 NOTE — Progress Notes (Signed)
 Physical Therapy Treatment Patient Details Name: Barry Sampson MRN: [id number] DOB: 04-01-1956 Today's Date: 04/11/2024   History of Present Illness Pt is a 68 y.o. male brought in by EMS after he was found wandering. Last hospitalized in 2015 for COPD exacerbation and has no regular healthcare. Daughter states that up until a couple weeks ago he was his normal self with no history of memory deficit, however over the past 2 weeks they noticed that he was forgetting stuff and on 1 occasion he found his glasses in the freezer. Fall ~4 days ago, presently oriented to self only. MRI negative. PMH of CAD s/p PCI, LBBB, systolic CHF (EF 40-98%), HTN, COPD, tobacco abuse, past history of cocaine abuse.    PT Comments  Pt stands and completes x 2 lap on unit with no AD.  Quick turns and backwards gait challenges during gait.  No LOB noted.  Visitor in room stated gait seems to be back at baseline.   If plan is discharge home, recommend the following: Assist for transportation;Help with stairs or ramp for entrance   Can travel by private vehicle        Equipment Recommendations  None recommended by PT    Recommendations for Other Services       Precautions / Restrictions Precautions Precautions: Fall Recall of Precautions/Restrictions: Impaired Restrictions Weight Bearing Restrictions Per Provider Order: No     Mobility  Bed Mobility               General bed mobility comments: in chair before and after Patient Response: Cooperative  Transfers Overall transfer level: Needs assistance Equipment used: None Transfers: Sit to/from Stand Sit to Stand: Supervision                Ambulation/Gait Ambulation/Gait assistance: Supervision Gait Distance (Feet): 400 Feet Assistive device: None Gait Pattern/deviations: Step-through pattern Gait velocity: decreased         Stairs             Wheelchair Mobility     Tilt Bed Tilt Bed Patient Response:  Cooperative  Modified Rankin (Stroke Patients Only)       Balance Overall balance assessment: Needs assistance Sitting-balance support: Feet supported Sitting balance-Leahy Scale: Good     Standing balance support: No upper extremity supported Standing balance-Leahy Scale: Fair                              Communication    Cognition Arousal: Alert Behavior During Therapy: WFL for tasks assessed/performed   PT - Cognitive impairments: No apparent impairments                                Cueing    Exercises      General Comments        Pertinent Vitals/Pain Pain Assessment Pain Assessment: No/denies pain Pain Intervention(s): Monitored during session    Home Living                          Prior Function            PT Goals (current goals can now be found in the care plan section) Progress towards PT goals: Progressing toward goals    Frequency    Min 2X/week      PT Plan      Co-evaluation  AM-PAC PT "6 Clicks" Mobility   Outcome Measure  Help needed turning from your back to your side while in a flat bed without using bedrails?: None Help needed moving from lying on your back to sitting on the side of a flat bed without using bedrails?: None Help needed moving to and from a bed to a chair (including a wheelchair)?: None Help needed standing up from a chair using your arms (e.g., wheelchair or bedside chair)?: None Help needed to walk in hospital room?: A Little Help needed climbing 3-5 steps with a railing? : A Little 6 Click Score: 22    End of Session Equipment Utilized During Treatment: Gait belt Activity Tolerance: Patient tolerated treatment well Patient left: in chair;with call bell/phone within reach;with family/visitor present Nurse Communication: Mobility status PT Visit Diagnosis: Unsteadiness on feet (R26.81);Difficulty in walking, not elsewhere classified (R26.2);Muscle  weakness (generalized) (M62.81)     Time: 4098-1191 PT Time Calculation (min) (ACUTE ONLY): 8 min  Charges:    $Gait Training: 8-22 mins PT General Charges $$ ACUTE PT VISIT: 1 Visit                   Charlanne Cong, PTA 04/11/24, 12:10 PM

## 2024-04-11 NOTE — Progress Notes (Addendum)
 SLP Cancellation Note  Patient Details Name: Barry Sampson MRN: [id number] DOB: [birth year]   Cancelled treatment:       Reason Eval/Treat Not Completed:  (chart reviewed; consulted w/  MD via secure chat re: pt's current status/needs/recs.)  Per chart review/Neurology notes, pt is a 68yo male found wandering at a gas station after driving around for hours.  Urine Drug Screen at at admit revealed Positive for Cocaine use, Marijuana use.  Per Neurology, pt "with abrupt onset cognitive/memory issues. On discussion with his daughters, he had no antecedent cognitive issues per their report. He continues to be quite impaired and has mildly abnormal MRI and CSF. 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  He did not knowingly use fentanyl, but this is becoming a more common contaminant."; Cocaine and Marijuana use per admit.  MRI at admit and 2 days later: "Subtle diffusion and FLAIR signal hyperintensity within the medial left hippocampus, without pathologic enhancement. This may be secondary to seizure activity. Infarct is considered less likely.   No acute intracranial abnormality.".  Pt was seen by both ST and OT services since admit; a SLUMS score w/ OT revealed a "1/30" performance on the screening tool.   In setting of his responses/performance at bedside screening and interview w/ Team/MD, and the impact from a toxic interaction(Cocaine, Marijuana Positive at admit) per chart notes/Neurology, pt would be recommended to have 100% Supervision at Discharge for safety w/ ADLs -- home w/ Family supervising unless he is discharging to SNF per PT/OT recs. He would also benefit from Neuropsych workup once in his familiar environment and post Acute illness/issues to identify any deficits/therapy needs.   This was shared w/ MD/Team for D/C planning. MD agreed.       Darla Edward, MS, CCC-SLP Speech Language Pathologist Rehab Services; [facility] [phone]  (ascom) Freja Faro 04/11/2024, 4:19 PM

## 2024-04-11 NOTE — Progress Notes (Signed)
 NEUROLOGY CONSULT FOLLOW UP NOTE   Date of service: April 11, 2024 Patient Name: Barry Sampson MRN:  [id number] DOB:  1956/09/13  Interval Hx/subjective   Patient continues to have severely impaired cognitive abilities including memory.  CSF was borderline.  Vitals   Vitals:   04/10/24 1610 04/10/24 2047 04/11/24 0425 04/11/24 0723  BP: 137/78 127/74 119/84 125/83  Pulse: 79 70 70 73  Resp: 18 20  15   Temp: 98.1 F (36.7 C) 98.2 F (36.8 C) (!) 97.4 F (36.3 C) 97.9 F (36.6 C)  TempSrc:      SpO2: 96% 93% 98% 99%  Weight:      Height:         Body mass index is 27.98 kg/m.  Physical Exam   Constitutional: Appears well-developed and well-nourished.  Neurologic Examination    MS: Awake, alert, unable to give the month or year.  He is able to tell me that he is at [facility].  He is unable to give me the number of quarters in $2.75, when trying to spell world backwards, he is unable to start the attempt, when I ask him what the last letter is in world he answers "d" then when I ask him what the letter is before that, he is unable to tell me.  Speech is fluent without aphasia CN: EOMI, visual fields full Motor: No drift in either upper extremity Sensory: Intact to light touch  Medications  Current Facility-Administered Medications:    acetaminophen  (TYLENOL ) tablet 650 mg, 650 mg, Oral, Q4H PRN, 650 mg at 04/11/24 1038 **OR** acetaminophen  (TYLENOL ) 160 MG/5ML solution 650 mg, 650 mg, Per Tube, Q4H PRN **OR** acetaminophen  (TYLENOL ) suppository 650 mg, 650 mg, Rectal, Q4H PRN, Lanetta Pion, MD   atorvastatin  (LIPITOR) tablet 40 mg, 40 mg, Oral, Daily, Althia Atlas, MD, 40 mg at 04/11/24 1028   cyanocobalamin (VITAMIN B12) tablet 1,000 mcg, 1,000 mcg, Oral, Daily, Bhagat, Srishti L, MD, 1,000 mcg at 04/11/24 1028   lisinopril (ZESTRIL) tablet 10 mg, 10 mg, Oral, Daily, Agbata, Tochukwu, MD, 10 mg at 04/11/24 1028   methylPREDNISolone sodium  succinate (SOLU-MEDROL) 1,000 mg in sodium chloride 0.9 % 50 mL IVPB, 1,000 mg, Intravenous, Q24H, Panayiotis [place], Althea Atkinson, MD   pantoprazole (PROTONIX) EC tablet 40 mg, 40 mg, Oral, Daily, Karlina Suares, Althea Atkinson, MD   thiamine (VITAMIN B1) tablet 100 mg, 100 mg, Oral, Daily, Bhagat, Srishti L, MD, 100 mg at 04/11/24 1028  Labs and Diagnostic Imaging   CSF WBC eight CSF RBC zero  Imaging(Personally reviewed): Two MRIs with subtle T2/diffusion change in the left medial temporal lobe.  Assessment   Barry Sampson is a 68 y.o. male with abrupt onset cognitive/memory issues.  On discussion with his daughters, he had no antecedent cognitive issues per their report.  He continues to be quite impaired and has mildly abnormal MRI and CSF.  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  He did not knowingly use  fentanyl, but this is becoming a more common contaminant.  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  His MRI is not the typical outlining of the hippocampus, however it is also subacute now with almost 2 weeks between onset and imaging.  I continue to suspect toxic etiology given the abrupt onset.  That being said, medial hippocampal change with mildly abnormal CSF findings could be  suggestive of an inflammatory component as well and will take some time to get the CSF autoimmune panel back.  I discussed this with his daughters, and would favor starting Solu-Medrol 1 g daily to assess for clinical response.  I do not think, at this point, with his CSF results that infection is at all likely.  He has no episodic symptomatology to suggest seizure activity.  Recommendations  Solu-Medrol 1 g daily x 5 days Await CSF autoimmune panel Serum autoimmune panel Protonix while on Solu-Medrol Neurology will continue to  follow ______________________________________________________________________   Signed, Ann Keto, MD [facility]

## 2024-04-12 DIAGNOSIS — F1410 Cocaine abuse, uncomplicated: Secondary | ICD-10-CM

## 2024-04-12 DIAGNOSIS — G9341 Metabolic encephalopathy: Secondary | ICD-10-CM | POA: Diagnosis not present

## 2024-04-12 DIAGNOSIS — Z9861 Coronary angioplasty status: Secondary | ICD-10-CM

## 2024-04-12 DIAGNOSIS — R839 Unspecified abnormal finding in cerebrospinal fluid: Secondary | ICD-10-CM | POA: Diagnosis not present

## 2024-04-12 DIAGNOSIS — I158 Other secondary hypertension: Secondary | ICD-10-CM

## 2024-04-12 DIAGNOSIS — I2510 Atherosclerotic heart disease of native coronary artery without angina pectoris: Secondary | ICD-10-CM

## 2024-04-12 DIAGNOSIS — J449 Chronic obstructive pulmonary disease, unspecified: Secondary | ICD-10-CM

## 2024-04-12 DIAGNOSIS — F17200 Nicotine dependence, unspecified, uncomplicated: Secondary | ICD-10-CM

## 2024-04-12 DIAGNOSIS — R0902 Hypoxemia: Secondary | ICD-10-CM

## 2024-04-12 DIAGNOSIS — R413 Other amnesia: Secondary | ICD-10-CM | POA: Diagnosis not present

## 2024-04-12 DIAGNOSIS — Z8679 Personal history of other diseases of the circulatory system: Secondary | ICD-10-CM

## 2024-04-12 LAB — CBC
HCT: 45 % (ref 39.0–52.0)
Hemoglobin: 15.3 g/dL (ref 13.0–17.0)
MCH: 31.4 pg (ref 26.0–34.0)
MCHC: 34 g/dL (ref 30.0–36.0)
MCV: 92.4 fL (ref 80.0–100.0)
Platelets: 174 10*3/uL (ref 150–400)
RBC: 4.87 MIL/uL (ref 4.22–5.81)
RDW: 12.3 % (ref 11.5–15.5)
WBC: 15.9 10*3/uL — ABNORMAL HIGH (ref 4.0–10.5)
nRBC: 0 % (ref 0.0–0.2)

## 2024-04-12 LAB — LYME DISEASE, WESTERN BLOT
IgG P18 Ab.: ABSENT
IgG P23 Ab.: ABSENT
IgG P28 Ab.: ABSENT
IgG P30 Ab.: ABSENT
IgG P39 Ab.: ABSENT
IgG P41 Ab.: ABSENT
IgG P45 Ab.: ABSENT
IgG P58 Ab.: ABSENT
IgG P66 Ab.: ABSENT
IgG P93 Ab.: ABSENT
IgM P23 Ab.: ABSENT
IgM P39 Ab.: ABSENT
IgM P41 Ab.: ABSENT
Lyme IgG Wb: NEGATIVE
Lyme IgM Wb: NEGATIVE

## 2024-04-12 LAB — BASIC METABOLIC PANEL WITH GFR
Anion gap: 9 (ref 5–15)
BUN: 20 mg/dL (ref 8–23)
CO2: 21 mmol/L — ABNORMAL LOW (ref 22–32)
Calcium: 9.2 mg/dL (ref 8.9–10.3)
Chloride: 107 mmol/L (ref 98–111)
Creatinine, Ser: 1 mg/dL (ref 0.61–1.24)
GFR, Estimated: >60 mL/min (ref >60)
Glucose, Bld: 137 mg/dL — ABNORMAL HIGH (ref 70–99)
Potassium: 4.1 mmol/L (ref 3.5–5.1)
Sodium: 137 mmol/L (ref 135–145)

## 2024-04-12 LAB — VITAMIN B1: Vitamin B1 (Thiamine): 99.4 nmol/L (ref 66.5–200.0)

## 2024-04-12 MED ORDER — HEPARIN SODIUM (PORCINE) 5000 UNIT/ML IJ SOLN
5000.0000 [IU] | Freq: Two times a day (BID) | INTRAMUSCULAR | Status: DC
  Administered 2024-04-12 – 2024-04-15 (×7): 5000 [IU] via SUBCUTANEOUS
  Filled 2024-04-12 (×7): qty 1

## 2024-04-12 NOTE — Progress Notes (Signed)
 Mobility Specialist - Progress Note    04/12/24 1400  Mobility  Activity Ambulated independently in hallway  Level of Assistance Modified independent, requires aide device or extra time  Assistive Device Front wheel walker  Distance Ambulated (ft) 160 ft  Range of Motion/Exercises Active  Activity Response Tolerated well  Mobility Referral Yes  Mobility visit 1 Mobility  Mobility Specialist Start Time (ACUTE ONLY) 1344  Mobility Specialist Stop Time (ACUTE ONLY) 1400  Mobility Specialist Time Calculation (min) (ACUTE ONLY) 16 min   Pt resting in bed on RA upon entry. Pt STS and ambulates to hallway around NS ModI with no AD. Pt required extra time due to increased distraction today and instability initially upon standing. Pt given verbal directional cuing throughout session. Pt returned to bed and left with needs in reach. Family present at bedside.   Jerri Morale Mobility Specialist 04/12/24, 2:06 PM

## 2024-04-12 NOTE — Progress Notes (Signed)
 PROGRESS NOTE    Barry Sampson  ZOX:[id number] DOB: [birth year] DOA: 04/06/2024 PCP: Pcp, No  Chief Complaint  Patient presents with   Altered Mental Status    Hospital Course:  Barry Sampson is 68 y.o. male with CAD status post PCI, left bundle branch block, systolic CHF with EF 25 to 30%, hypertension, COPD, tobacco abuse, history of cocaine abuse, who was brought in by EMS after he was found wandering.  His daughter reports that until 2 weeks prior to arrival he was at his mental status baseline.  2 weeks prior to arrival he began having gradual memory deficits including forgetting normal things and misplacing his items and sustained a fall.  In the ED blood pressure was 166/92, UDS positive for cocaine and cannabinoids, all other lab work unremarkable.  Head CT showed generalized cerebral atrophy with ventricular dilation but no acute changes.  He was admitted for further workup.  Neurology was consulted  Subjective: This morning patient is pleasantly confused.  He has no acute complaints.  He reports the year is 48 but accurately reports that he is in [place], at the hospital, and knows the president.   Objective: Vitals:   04/11/24 1540 04/11/24 1949 04/12/24 0503 04/12/24 0736  BP: 138/76 105/86 (!) 111/92 (!) 129/90  Pulse: 66 74 71 85  Resp: 19 16 18 18   Temp: 98.4 F (36.9 C) 98.6 F (37 C) (!) 97.5 F (36.4 C) 97.9 F (36.6 C)  TempSrc: Oral Oral Oral   SpO2: 97% 96% 95% 96%  Weight:      Height:       No intake or output data in the 24 hours ending 04/12/24 1440 Filed Weights   04/06/24 1921  Weight: 88.5 kg    Examination: General exam: Appears calm and comfortable, NAD  Respiratory system: No work of breathing, symmetric chest wall expansion Cardiovascular system: S1 & S2 heard, RRR.  Gastrointestinal system: Abdomen is nondistended, soft and nontender.  Neuro: Alert, oriented to person, place, situation.  Disoriented to date and year Extremities:  Symmetric, expected ROM Skin: No rashes, lesions Psychiatry: Demonstrates appropriate judgement and insight. Mood & affect appropriate for situation.   Assessment & Plan:  Principal Problem:   Acute metabolic encephalopathy Active Problems:   Hypoxia   Cocaine abuse (HCC)   COPD (chronic obstructive pulmonary disease) (HCC)   CAD S/P percutaneous coronary angioplasty   HTN (hypertension)   Tobacco use disorder   History of systolic CHF (congestive heart failure)   Encephalopathy, toxic versus metabolic.  Workup ongoing - Etiology remains unclear.  May be multifactorial History of substance abuse. - CT scan without acute findings - Neurology consulted.  On their review of MRI there appeared to be some mild abnormalities as well as some abnormalities on CSF sampling.  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  Though he denies knowingly using fentanyl that is possible that there was unintentional overdose impacting the hippocampus. - Neurology also endorses possibility of inflammatory component and has initiated the patient on Solu-Medrol 1 g daily x 5 days - No obvious signs of infectious etiology - CSF autoimmune panel still pending - Serum autoimmune panel: - Protonix: Solu-Medrol - Appreciate further neurology recommendations - EEG consistent with generalized nonspecific cerebral dysfunction - CSF: Normal glucose, slightly elevated protein - Meningitis/encephalitis panel negative.  No organisms seen.  Culture negative - On empiric therapy with thiamine and B12, continue - Continue frequent orientation and delirium precautions - PT/OT as  tolerated - Please note: At daughter's request there were attempts made to transfer patient to [facility].  Neurology discussed with previous attending that there is no indication for transfer given chronicity of changes on CT in the setting of acute drug use.  Hypoxia - Reportedly became hypoxic to 85% on room air  following admission was placed on O2.  This has since resolved - Patient maintains O2 sats on room air - D-dimer 0.74 - CXR unremarkable  Tobacco use disorder - Continue nicotine patch  Hypertension - Continue lisinopril  CAD status post percutaneous coronary angioplasty Systolic congestive heart failure, EF 25 to 30% Left bundle branch block - No chest pain, dyspnea, acute changes on EKG. - Continue with lisinopril - ECHO 25 to 30%, severely decreased left ventricular function, with hypokinesis, grade 1 diastolic dysfunction.  Aortic valve regurg mild. - Have counseled against drug abuse - Will need outpatient follow-up with cardiology  Hyperlipidemia - LDL 168, goal less than 100 - Continue statin - Follow-up outpatient with PCP in 3 to 6 months for cholesterol recheck  COPD, not currently in exacerbation - DuoNebs as needed  DVT prophylaxis: Heparin   Code Status: Full Code Disposition:  inpatient, work up ongoing.  IV Steroids through 6/15  Consultants:    Procedures:  LP  Antimicrobials:  Anti-infectives (From admission, onward)    None       Data Reviewed: I have personally reviewed following labs and imaging studies CBC: Recent Labs  Lab 04/06/24 1928 04/07/24 0919 04/08/24 0457 04/09/24 0508 04/10/24 0258 04/11/24 0515 04/12/24 0538  WBC 7.5   < > 7.0 8.3 8.2 7.7 15.9*  NEUTROABS 3.7  --   --   --   --   --   --   HGB 14.9   < > 14.6 15.5 14.6 14.7 15.3  HCT 44.3   < > 42.9 45.3 41.8 43.3 45.0  MCV 96.3   < > 93.3 92.1 93.1 93.7 92.4  PLT 188   < > 172 169 164 155 174   < > = values in this interval not displayed.   Basic Metabolic Panel: Recent Labs  Lab 04/07/24 0919 04/08/24 0457 04/09/24 0508 04/10/24 0258 04/11/24 0515 04/12/24 0538  NA 137 139 138 138 139 137  K 4.0 3.7 3.8 3.6 3.8 4.1  CL 106 107 107 108 105 107  CO2 23 25 24 26 28  21*  GLUCOSE 102* 112* 119* 124* 102* 137*  BUN 14 15 15 19 18 20   CREATININE 0.81 0.95 0.92  1.04 1.04 1.00  CALCIUM  8.7* 8.7* 9.1 8.8* 9.0 9.2  MG 2.2 2.3 2.1 2.2  --   --   PHOS 2.8 3.3 2.9 4.0  --   --    GFR: Estimated Creatinine Clearance: 78.1 mL/min (by C-G formula based on SCr of 1 mg/dL). Liver Function Tests: Recent Labs  Lab 04/06/24 1928  AST 35  ALT 36  ALKPHOS 41  BILITOT 0.8  PROT 7.9  ALBUMIN 4.0   CBG: No results for input(s): GLUCAP in the last 168 hours.  Recent Results (from the past 240 hours)  CSF culture w Gram Stain     Status: None (Preliminary result)   Collection Time: 04/10/24  3:39 PM   Specimen: CSF; Cerebrospinal Fluid  Result Value Ref Range Status   Specimen Description   Final    CSF Performed at [facility], [address], [place], [place] [address]    Special Requests  Final    NONE Performed at [facility], [address] [place], [place] [address]    Gram Stain   Final    NO ORGANISMS SEEN WBC SEEN RED BLOOD CELLS SEEN Performed at [facility], [address] [place], [place] [address]    Culture   Final    NO GROWTH 2 DAYS Performed at [facility], [address], [place], [place] [address]    Report Status PENDING  Incomplete  Culture, Fungus without Smear     Status: None (Preliminary result)   Collection Time: 04/10/24  3:39 PM   Specimen: PATH Cytology CSF; Cerebrospinal Fluid  Result Value Ref Range Status   Specimen Description   Final    CSF Performed at [facility], [address], [place], [place] [address]    Special Requests   Final    NONE Performed at [facility], [address] [place], [place] [address]    Culture   Final    NO FUNGUS ISOLATED AFTER 2 DAYS Performed at [facility], [address] [place], [place] [address]    Report Status PENDING  Incomplete     Radiology Studies: DG FL GUIDED LUMBAR PUNCTURE Result Date: 04/10/2024 CLINICAL DATA:  68 year old male with new onset altered mental status for the past  2 weeks. EXAM: LUMBAR PUNCTURE UNDER FLUOROSCOPY PROCEDURE: The risks (including hemorrhage, infection, and headache, among others), benefits, and alternatives to fluoroscopically guided lumbar puncture was discussed with the patient's daughter. The patient is unable to consent himself due to altered mental status. The patient's daughter understood and elected for the patient to undergo the procedure. Standard time-out was employed. An appropriate skin entry site was determined fluoroscopically. Operator donned sterile gloves and mask. Skin site was marked, then prepped with Betadine, draped in usual sterile fashion, and infiltrated locally with 1% lidocaine. A 20 gauge spinal needle advanced into the thecal sac at L2-3 from a left interlaminar approach. Clear colorless CSF spontaneously returned, with opening pressure of 14 cm water. 14 ml CSF were collected and divided among 4 sterile vials for the requested laboratory studies. The needle was then removed. The patient tolerated the procedure well and there were no complications. FLUOROSCOPY: Radiation Exposure Index (as provided by the fluoroscopic device): 13.0 mGy Kerma IMPRESSION: Technically successful lumbar puncture under fluoroscopy. This exam was performed by [facility] PA-C, and was supervised and interpreted by Dr. Robby Chime. Electronically Signed   By: Freida Jes M.D.   On: 04/10/2024 16:33    Scheduled Meds:  atorvastatin   40 mg Oral Daily   vitamin B-12  1,000 mcg Oral Daily   lisinopril  10 mg Oral Daily   pantoprazole  40 mg Oral Daily   polyethylene glycol  17 g Oral Daily   thiamine  100 mg Oral Daily   Continuous Infusions:  methylPREDNISolone (SOLU-MEDROL) injection 1,000 mg (04/12/24 1259)     LOS: 5 days  MDM: Patient is high risk for one or more organ failure.  They necessitate ongoing hospitalization for continued IV therapies and subsequent lab monitoring. Total time spent interpreting labs and vitals,  reviewing the medical record, coordinating care amongst consultants and care team members, directly assessing and discussing care with the patient and/or family: 55 min  Jomaira Darr, DO [facility]  To contact the attending physician between 7A-7P please use Epic Chat. To contact the covering physician during after hours 7P-7A, please review Amion.  04/12/2024, 2:40  PM   *This document has been created with the assistance of dictation software. Please excuse typographical errors. *

## 2024-04-12 NOTE — Progress Notes (Signed)
 NEUROLOGY CONSULT FOLLOW UP NOTE   Date of service: April 12, 2024 Patient Name: Barry Sampson MRN:  [id number] DOB:  1956-05-21  Interval Hx/subjective   No significant changes  Vitals   Vitals:   04/11/24 1540 04/11/24 1949 04/12/24 0503 04/12/24 0736  BP: 138/76 105/86 (!) 111/92 (!) 129/90  Pulse: 66 74 71 85  Resp: 19 16 18 18   Temp: 98.4 F (36.9 C) 98.6 F (37 C) (!) 97.5 F (36.4 C) 97.9 F (36.6 C)  TempSrc: Oral Oral Oral   SpO2: 97% 96% 95% 96%  Weight:      Height:         Body mass index is 27.98 kg/m.  Physical Exam   Constitutional: Appears well-developed and well-nourished.  Neurologic Examination    MS: Awake, alert, unable to give the month or year.  Unable to spell world backwards CN: EOMI, visual fields full Motor: No drift in either upper extremity Sensory: Intact to light touch  Medications  Current Facility-Administered Medications:    acetaminophen  (TYLENOL ) tablet 650 mg, 650 mg, Oral, Q4H PRN, 650 mg at 04/11/24 1038 **OR** acetaminophen  (TYLENOL ) 160 MG/5ML solution 650 mg, 650 mg, Per Tube, Q4H PRN **OR** acetaminophen  (TYLENOL ) suppository 650 mg, 650 mg, Rectal, Q4H PRN, Barry Pion, MD   atorvastatin  (LIPITOR) tablet 40 mg, 40 mg, Oral, Daily, Barry Atlas, MD, 40 mg at 04/12/24 0940   cyanocobalamin (VITAMIN B12) tablet 1,000 mcg, 1,000 mcg, Oral, Daily, Bhagat, Srishti L, MD, 1,000 mcg at 04/12/24 0940   heparin injection 5,000 Units, 5,000 Units, Subcutaneous, Q12H, Dezii, Alexandra, DO   lisinopril (ZESTRIL) tablet 10 mg, 10 mg, Oral, Daily, Agbata, Tochukwu, MD, 10 mg at 04/12/24 0940   methylPREDNISolone sodium succinate (SOLU-MEDROL) 1,000 mg in sodium chloride 0.9 % 50 mL IVPB, 1,000 mg, Intravenous, Q24H, Barry Leber, MD, Last Rate: 66 mL/hr at 04/12/24 1259, 1,000 mg at 04/12/24 1259   pantoprazole (PROTONIX) EC tablet 40 mg, 40 mg, Oral, Daily, Barry Leber, MD, 40 mg at 04/12/24 0940    polyethylene glycol (MIRALAX / GLYCOLAX) packet 17 g, 17 g, Oral, Daily, Agbata, Tochukwu, MD, 17 g at 04/12/24 0940   thiamine (VITAMIN B1) tablet 100 mg, 100 mg, Oral, Daily, Bhagat, Srishti L, MD, 100 mg at 04/12/24 0940  Labs and Diagnostic Imaging   CSF WBC eight CSF RBC zero Csf protein 49 Csf glucose nml  Imaging(Personally reviewed): Two MRIs with subtle T2/diffusion change in the left medial temporal lobe.  Assessment   Barry Sampson is a 68 y.o. male with abrupt onset cognitive/memory issues.  On discussion with his daughters, he had no antecedent cognitive issues per their report.  He continues to be quite impaired and has mildly abnormal MRI and CSF.  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  He did not knowingly use  fentanyl, but this is becoming a more common contaminant.   His MRI is not the typical outlining of the hippocampus, however it is also subacute now with almost 2 weeks between onset and imaging.  I continue to suspect toxic etiology given the abrupt onset.  That being said, medial hippocampal change with mildly abnormal CSF findings could be suggestive of an inflammatory component as well, and it will take some time to get the CSF autoimmune panel back.  I discussed this with his daughters, and have started Solu-Medrol 1 g daily to assess for clinical response.  I do not think, at this  point, with his CSF results that infection is at all likely.  He has no episodic symptomatology to suggest seizure activity.  Recommendations  Solu-Medrol 1 g daily x 5 days(day 2) Await CSF autoimmune panel Await Serum autoimmune panel Protonix while on Solu-Medrol Neurology will continue to follow ______________________________________________________________________   Signed, Barry Keto, MD [facility]

## 2024-04-13 DIAGNOSIS — R413 Other amnesia: Secondary | ICD-10-CM | POA: Diagnosis not present

## 2024-04-13 DIAGNOSIS — R839 Unspecified abnormal finding in cerebrospinal fluid: Secondary | ICD-10-CM | POA: Diagnosis not present

## 2024-04-13 LAB — IGG CSF INDEX
Albumin CSF-mCnc: 24 mg/dL (ref 15–55)
Albumin: 4 g/dL (ref 3.9–4.9)
CSF IgG Index: 0.5 (ref 0.0–0.7)
IgG (Immunoglobin G), Serum: 1023 mg/dL (ref 603–1613)
IgG, CSF: 3.2 mg/dL (ref 0.0–10.3)
IgG/Alb Ratio, CSF: 0.13 (ref 0.00–0.25)

## 2024-04-13 LAB — CSF CULTURE W GRAM STAIN: Gram Stain: NONE SEEN

## 2024-04-13 LAB — MISC LABCORP TEST (SEND OUT): Labcorp test code: 505535

## 2024-04-13 LAB — METHYLMALONIC ACID, SERUM: Methylmalonic Acid, Quantitative: 260 nmol/L (ref 0–378)

## 2024-04-13 NOTE — Progress Notes (Signed)
 NEUROLOGY CONSULT FOLLOW UP NOTE   Date of service: April 13, 2024 Patient Name: Barry Sampson MRN:  [id number] DOB:  November 28, 1955  Interval Hx/subjective   PAtient reports he feels less confused, unclear if he is a reliable historian, though. He also reports that the cocaine that he used felt different than typical cocaine.   Vitals   Vitals:   04/12/24 0736 04/12/24 1940 04/13/24 0520 04/13/24 0752  BP: (!) 129/90 (!) 124/97 125/75 134/76  Pulse: 85 86 82 64  Resp: 18 18 17 18   Temp: 97.9 F (36.6 C) 98.6 F (37 C) 98.3 F (36.8 C) 98.2 F (36.8 C)  TempSrc:  Oral    SpO2: 96% 92% 96% 97%  Weight:      Height:         Body mass index is 27.98 kg/m.  Physical Exam   Constitutional: Appears well-developed and well-nourished.  Neurologic Examination    MS: Awake, alert, unable to give the month or year.  He is able to tell me that he is at [facility].  # of quarters in $2.75 is 8, able to give me month and age.  CN: EOMI, visual fields full Motor: MAEW Sensory: Intact to light touch  Medications  Current Facility-Administered Medications:    acetaminophen  (TYLENOL ) tablet 650 mg, 650 mg, Oral, Q4H PRN, 650 mg at 04/11/24 1038 **OR** acetaminophen  (TYLENOL ) 160 MG/5ML solution 650 mg, 650 mg, Per Tube, Q4H PRN **OR** acetaminophen  (TYLENOL ) suppository 650 mg, 650 mg, Rectal, Q4H PRN, Lanetta Pion, MD   atorvastatin  (LIPITOR) tablet 40 mg, 40 mg, Oral, Daily, Althia Atlas, MD, 40 mg at 04/13/24 0816   cyanocobalamin (VITAMIN B12) tablet 1,000 mcg, 1,000 mcg, Oral, Daily, Bhagat, Srishti L, MD, 1,000 mcg at 04/13/24 0816   heparin injection 5,000 Units, 5,000 Units, Subcutaneous, Q12H, Dezii, Alexandra, DO, 5,000 Units at 04/13/24 0817   lisinopril (ZESTRIL) tablet 10 mg, 10 mg, Oral, Daily, Agbata, Tochukwu, MD, 10 mg at 04/13/24 0816   methylPREDNISolone sodium succinate (SOLU-MEDROL) 1,000 mg in sodium chloride 0.9 % 50 mL IVPB, 1,000 mg,  Intravenous, Q24H, Augustin Leber, MD, Last Rate: 66 mL/hr at 04/12/24 1259, 1,000 mg at 04/12/24 1259   pantoprazole (PROTONIX) EC tablet 40 mg, 40 mg, Oral, Daily, Augustin Leber, MD, 40 mg at 04/13/24 0816   polyethylene glycol (MIRALAX / GLYCOLAX) packet 17 g, 17 g, Oral, Daily, Agbata, Tochukwu, MD, 17 g at 04/13/24 0817   thiamine (VITAMIN B1) tablet 100 mg, 100 mg, Oral, Daily, Bhagat, Srishti L, MD, 100 mg at 04/13/24 0816  Labs and Diagnostic Imaging   CSF WBC eight CSF RBC zero Csf protein 49 IgG index normal.   Imaging(Personally reviewed): Two MRIs with subtle T2/diffusion change in the left medial temporal lobe.  Assessment   AMOL DOMANSKI is a 68 y.o. male with abrupt onset cognitive/memory issues.  On discussion with his daughters, he had no antecedent cognitive issues per their report.  He continues to be quite impaired and has mildly abnormal MRI and CSF.  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  He did not knowingly use  fentanyl, but this is becoming a more common contaminant.  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  I continue to suspect toxic etiology given the abrupt onset.  That being said, medial hippocampal change with mildly abnormal CSF  findings could be suggestive of an inflammatory component as well and will take some time to get the CSF autoimmune panel back.  I discussed this with his daughters, and he is currently receiving solumedrol.   I do not think, at this point, with his CSF results that infection is at all likely.  He has no episodic symptomatology to suggest seizure activity.  Recommendations  Solu-Medrol 1 g daily x 5 days, day 3 Await CSF autoimmune panel Await serum autoimmune panel Protonix while on Solu-Medrol Neurology will continue to  follow ______________________________________________________________________   Signed, Ann Keto, MD [facility]

## 2024-04-13 NOTE — Progress Notes (Signed)
 PROGRESS NOTE    Barry Sampson  ZOX:[id number] DOB: Jul 15, 1956 DOA: 04/06/2024 PCP: Pcp, No  Chief Complaint  Patient presents with   Altered Mental Status    Hospital Course:  Barry Sampson is 69 y.o. male with CAD status post PCI, left bundle branch block, systolic CHF with EF 25 to 30%, hypertension, COPD, tobacco abuse, history of cocaine abuse, who was brought in by EMS after he was found wandering.  His daughter reports that until 2 weeks prior to arrival he was at his mental status baseline.  2 weeks prior to arrival he began having gradual memory deficits including forgetting normal things and misplacing his items and sustained a fall.  In the ED blood pressure was 166/92, UDS positive for cocaine and cannabinoids, all other lab work unremarkable.  Head CT showed generalized cerebral atrophy with ventricular dilation but no acute changes.  He was admitted for further workup.  Neurology was consulted  Subjective: No acute events overnight.  His daughter is at bedside today.  She reports that she does appreciate some improvement in his mentation today.  Objective: Vitals:   04/12/24 0736 04/12/24 1940 04/13/24 0520 04/13/24 0752  BP: (!) 129/90 (!) 124/97 125/75 134/76  Pulse: 85 86 82 64  Resp: 18 18 17 18   Temp: 97.9 F (36.6 C) 98.6 F (37 C) 98.3 F (36.8 C) 98.2 F (36.8 C)  TempSrc:  Oral    SpO2: 96% 92% 96% 97%  Weight:      Height:        Intake/Output Summary (Last 24 hours) at 04/13/2024 1421 Last data filed at 04/12/2024 1500 Gross per 24 hour  Intake 303.71 ml  Output --  Net 303.71 ml   Filed Weights   04/06/24 1921  Weight: 88.5 kg    Examination: General exam: Appears calm and comfortable, NAD  Respiratory system: No work of breathing, symmetric chest wall expansion Cardiovascular system: S1 & S2 heard, RRR.  Gastrointestinal system: Abdomen is nondistended, soft and nontender.  Neuro: Alert, oriented to person, place, situation.    Extremities: Symmetric, expected ROM Skin: No rashes, lesions Psychiatry: Demonstrates appropriate judgement and insight. Mood & affect appropriate for situation.   Assessment & Plan:  Principal Problem:   Acute metabolic encephalopathy Active Problems:   Hypoxia   Cocaine abuse (HCC)   COPD (chronic obstructive pulmonary disease) (HCC)   CAD S/P percutaneous coronary angioplasty   HTN (hypertension)   Tobacco use disorder   History of systolic CHF (congestive heart failure)   Encephalopathy, toxic versus metabolic.  Workup ongoing - Etiology remains unclear.  May be multifactorial History of substance abuse. - CT scan without acute findings - Neurology consulted.  On their review of MRI there appeared to be some mild abnormalities as well as some abnormalities on CSF sampling.  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  Though he denies knowingly using fentanyl that is possible that there was unintentional overdose perhaps with altered cocaine.  Concerned that this is impacting hepatitis - Neurology also endorses possibility of inflammatory component and has initiated the patient on Solu-Medrol 1 g daily x 5 days.  Will continue with this through 6/15 - Per his daughter's bedside evaluation she believes there has been some improvement - No obvious signs of infectious etiology - CSF autoimmune panel still pending - Continue with Protonix mild Solu-Medrol - Appreciate further neurology recommendations - Continue close monitoring of mental status. - EEG consistent with generalized nonspecific cerebral  dysfunction - CSF: Normal glucose, slightly elevated protein - Meningitis/encephalitis panel negative.  No organisms seen.  Culture negative - On empiric therapy with thiamine and B12, continue - Continue frequent orientation and delirium precautions - Continue PT/OT - Please note: At daughter's request there were attempts made to transfer patient to [facility].  Neurology discussed with previous attending that there is no indication for transfer given chronicity of changes on CT in the setting of acute drug use.  Hypoxia - Reportedly became hypoxic to 85% on room air following admission was placed on O2.  This has since resolved - Remains stable on room air - D-dimer 0.74 - CXR unremarkable  Tobacco use disorder - Continue nicotine patch  Hypertension - Continue lisinopril, close daily monitoring.  Titrate as needed  CAD status post percutaneous coronary angioplasty Systolic congestive heart failure, EF 25 to 30% Left bundle branch block - No chest pain, dyspnea, acute changes on EKG. - Continue with lisinopril - ECHO 25 to 30%, severely decreased left ventricular function, with hypokinesis, grade 1 diastolic dysfunction.  Aortic valve regurg mild. - Have counseled against drug abuse - Will need outpatient follow-up with cardiology  Hyperlipidemia - LDL 168, goal less than 100 - Continue statin - Follow-up outpatient with PCP in 3 to 6 months for cholesterol recheck  COPD, not currently in exacerbation - DuoNebs as needed  DVT prophylaxis: Heparin   Code Status: Full Code Disposition:  inpatient, work up ongoing.  IV Steroids through 6/15  Consultants:    Procedures:  LP  Antimicrobials:  Anti-infectives (From admission, onward)    None       Data Reviewed: I have personally reviewed following labs and imaging studies CBC: Recent Labs  Lab 04/06/24 1928 04/07/24 0919 04/08/24 0457 04/09/24 0508 04/10/24 0258 04/11/24 0515 04/12/24 0538  WBC 7.5   < > 7.0 8.3 8.2 7.7 15.9*  NEUTROABS 3.7  --   --   --   --   --   --   HGB 14.9   < > 14.6 15.5 14.6 14.7 15.3  HCT 44.3   < > 42.9 45.3 41.8 43.3 45.0  MCV 96.3   < > 93.3 92.1 93.1 93.7 92.4  PLT 188   < > 172 169 164 155 174   < > = values in this interval not displayed.   Basic Metabolic Panel: Recent Labs  Lab 04/07/24 0919 04/08/24 0457  04/09/24 0508 04/10/24 0258 04/11/24 0515 04/12/24 0538  NA 137 139 138 138 139 137  K 4.0 3.7 3.8 3.6 3.8 4.1  CL 106 107 107 108 105 107  CO2 23 25 24 26 28  21*  GLUCOSE 102* 112* 119* 124* 102* 137*  BUN 14 15 15 19 18 20   CREATININE 0.81 0.95 0.92 1.04 1.04 1.00  CALCIUM  8.7* 8.7* 9.1 8.8* 9.0 9.2  MG 2.2 2.3 2.1 2.2  --   --   PHOS 2.8 3.3 2.9 4.0  --   --    GFR: Estimated Creatinine Clearance: 78.1 mL/min (by C-G formula based on SCr of 1 mg/dL). Liver Function Tests: Recent Labs  Lab 04/06/24 1928 04/10/24 1539  AST 35  --   ALT 36  --   ALKPHOS 41  --   BILITOT 0.8  --   PROT 7.9  --   ALBUMIN 4.0 4.0   CBG: No results for input(s): GLUCAP in the last 168 hours.  Recent Results (from the past 240 hours)  CSF  culture w Gram Stain     Status: None (Preliminary result)   Collection Time: 04/10/24  3:39 PM   Specimen: CSF; Cerebrospinal Fluid  Result Value Ref Range Status   Specimen Description   Final    CSF Performed at [facility], [address] [place], [place] [address]    Special Requests   Final    NONE Performed at [facility], [address] [place], [place] [address]    Gram Stain   Final    NO ORGANISMS SEEN WBC SEEN RED BLOOD CELLS SEEN Performed at [facility], [address] [place], [place] [address]    Culture   Final    NO GROWTH 3 DAYS Performed at [facility], [address], [place], [place] [address]    Report Status PENDING  Incomplete  Culture, Fungus without Smear     Status: None (Preliminary result)   Collection Time: 04/10/24  3:39 PM   Specimen: PATH Cytology CSF; Cerebrospinal Fluid  Result Value Ref Range Status   Specimen Description   Final    CSF Performed at [facility], [address] [place], [place] [address]    Special Requests   Final    NONE Performed at [facility], [address] [place], [place] [address]    Culture   Final    NO  FUNGUS ISOLATED AFTER 3 DAYS Performed at [facility], [address] [place], [place] [address]    Report Status PENDING  Incomplete     Radiology Studies: No results found.   Scheduled Meds:  atorvastatin   40 mg Oral Daily   vitamin B-12  1,000 mcg Oral Daily   heparin injection (subcutaneous)  5,000 Units Subcutaneous Q12H   lisinopril  10 mg Oral Daily   pantoprazole  40 mg Oral Daily   polyethylene glycol  17 g Oral Daily   thiamine  100 mg Oral Daily   Continuous Infusions:  methylPREDNISolone (SOLU-MEDROL) injection 1,000 mg (04/13/24 1212)     LOS: 6 days  MDM: Patient is high risk for one or more organ failure.  They necessitate ongoing hospitalization for continued IV therapies and subsequent lab monitoring. Total time spent interpreting labs and vitals, reviewing the medical record, coordinating care amongst consultants and care team members, directly assessing and discussing care with the patient and/or family: 55 min  Daijon Wenke, DO [facility]  To contact the attending physician between 7A-7P please use Epic Chat. To contact the covering physician during after hours 7P-7A, please review Amion.  04/13/2024, 2:21 PM   *This document has been created with the assistance of dictation software. Please excuse typographical errors. *

## 2024-04-14 DIAGNOSIS — G9341 Metabolic encephalopathy: Secondary | ICD-10-CM | POA: Diagnosis not present

## 2024-04-14 MED ORDER — ASPIRIN 81 MG PO TBEC
81.0000 mg | DELAYED_RELEASE_TABLET | Freq: Every day | ORAL | Status: DC
  Administered 2024-04-14 – 2024-04-15 (×2): 81 mg via ORAL
  Filled 2024-04-14 (×2): qty 1

## 2024-04-14 NOTE — Plan of Care (Signed)
  Problem: Ischemic Stroke/TIA Tissue Perfusion: Goal: Complications of ischemic stroke/TIA will be minimized Outcome: Progressing   Problem: Education: Goal: Knowledge of patient specific risk factors will improve (DELETE if not current risk factor) Outcome: Progressing   Problem: Education: Goal: Knowledge of secondary prevention will improve (MUST DOCUMENT ALL) Outcome: Progressing   Problem: Education: Goal: Knowledge of disease or condition will improve Outcome: Progressing   Problem: Clinical Measurements: Goal: Ability to maintain clinical measurements within normal limits will improve Outcome: Progressing   Problem: Pain Managment: Goal: General experience of comfort will improve and/or be controlled Outcome: Progressing   Problem: Safety: Goal: Ability to remain free from injury will improve Outcome: Progressing   Problem: Education: Goal: Knowledge of General Education information will improve Description: Including pain rating scale, medication(s)/side effects and non-pharmacologic comfort measures Outcome: Progressing

## 2024-04-14 NOTE — Plan of Care (Signed)
  Problem: Ischemic Stroke/TIA Tissue Perfusion: Goal: Complications of ischemic stroke/TIA will be minimized Outcome: Progressing   Problem: Education: Goal: Knowledge of disease or condition will improve Outcome: Progressing   Problem: Coping: Goal: Will verbalize positive feelings about self Outcome: Progressing Goal: Will identify appropriate support needs Outcome: Progressing   Problem: Health Behavior/Discharge Planning: Goal: Ability to manage health-related needs will improve Outcome: Progressing Goal: Goals will be collaboratively established with patient/family Outcome: Progressing   Problem: Self-Care: Goal: Ability to participate in self-care as condition permits will improve Outcome: Progressing Goal: Verbalization of feelings and concerns over difficulty with self-care will improve Outcome: Progressing Goal: Ability to communicate needs accurately will improve Outcome: Progressing   Problem: Nutrition: Goal: Risk of aspiration will decrease Outcome: Progressing Goal: Dietary intake will improve Outcome: Progressing

## 2024-04-14 NOTE — TOC Progression Note (Signed)
 Transition of Care [facility]) - Progression Note    Patient Details  Name: Barry Sampson MRN: [id number] Date of Birth: 06/22/1956  Transition of Care [facility]) CM/SW Contact  Crayton Docker, RN 04/14/2024, 10:51 AM  Clinical Narrative:     Home health PT/OT orders noted. CM call to [facility], phone: [phone] regarding home health referral. Per Verdis Glade, agency will follow for home health services.   Expected Discharge Plan: Home/Self Care Barriers to Discharge: Continued Medical Work up  Expected Discharge Plan and Services    Home with home health PT/OT; [facility]     Social Determinants of Health (SDOH) Interventions SDOH Screenings   Food Insecurity: No Food Insecurity (04/07/2024)  Housing: Low Risk  (04/07/2024)  Transportation Needs: No Transportation Needs (04/07/2024)  Utilities: Not At Risk (04/07/2024)  Social Connections: Patient Declined (04/07/2024)  Tobacco Use: High Risk (04/07/2024)    Readmission Risk Interventions     No data to display

## 2024-04-14 NOTE — Plan of Care (Signed)
 Problem: Education: Goal: Knowledge of disease or condition will improve 04/14/2024 2121 by Lethia Raveling, RN Outcome: Progressing 04/14/2024 2116 by Lethia Raveling, RN Outcome: Progressing Goal: Knowledge of secondary prevention will improve (MUST DOCUMENT ALL) 04/14/2024 2121 by Lethia Raveling, RN Outcome: Progressing 04/14/2024 2116 by Lethia Raveling, RN Outcome: Progressing Goal: Knowledge of patient specific risk factors will improve (DELETE if not current risk factor) 04/14/2024 2121 by Lethia Raveling, RN Outcome: Progressing 04/14/2024 2116 by Lethia Raveling, RN Outcome: Progressing   Problem: Ischemic Stroke/TIA Tissue Perfusion: Goal: Complications of ischemic stroke/TIA will be minimized 04/14/2024 2121 by Lethia Raveling, RN Outcome: Progressing 04/14/2024 2116 by Lethia Raveling, RN Outcome: Progressing   Problem: Coping: Goal: Will verbalize positive feelings about self 04/14/2024 2121 by Lethia Raveling, RN Outcome: Progressing 04/14/2024 2116 by Lethia Raveling, RN Outcome: Progressing Goal: Will identify appropriate support needs 04/14/2024 2121 by Lethia Raveling, RN Outcome: Progressing 04/14/2024 2116 by Lethia Raveling, RN Outcome: Progressing   Problem: Health Behavior/Discharge Planning: Goal: Ability to manage health-related needs will improve 04/14/2024 2121 by Lethia Raveling, RN Outcome: Progressing 04/14/2024 2116 by Lethia Raveling, RN Outcome: Progressing Goal: Goals will be collaboratively established with patient/family 04/14/2024 2121 by Lethia Raveling, RN Outcome: Progressing 04/14/2024 2116 by Lethia Raveling, RN Outcome: Progressing   Problem: Self-Care: Goal: Ability to participate in self-care as condition permits will improve 04/14/2024 2121 by Lethia Raveling, RN Outcome: Progressing 04/14/2024 2116 by Lethia Raveling, RN Outcome: Progressing Goal: Verbalization of feelings and concerns over  difficulty with self-care will improve 04/14/2024 2121 by Lethia Raveling, RN Outcome: Progressing 04/14/2024 2116 by Lethia Raveling, RN Outcome: Progressing Goal: Ability to communicate needs accurately will improve 04/14/2024 2121 by Lethia Raveling, RN Outcome: Progressing 04/14/2024 2116 by Lethia Raveling, RN Outcome: Progressing   Problem: Nutrition: Goal: Risk of aspiration will decrease 04/14/2024 2121 by Lethia Raveling, RN Outcome: Progressing 04/14/2024 2116 by Lethia Raveling, RN Outcome: Progressing Goal: Dietary intake will improve 04/14/2024 2121 by Lethia Raveling, RN Outcome: Progressing 04/14/2024 2116 by Lethia Raveling, RN Outcome: Progressing   Problem: Education: Goal: Knowledge of General Education information will improve Description: Including pain rating scale, medication(s)/side effects and non-pharmacologic comfort measures 04/14/2024 2121 by Lethia Raveling, RN Outcome: Progressing 04/14/2024 2116 by Lethia Raveling, RN Outcome: Progressing   Problem: Health Behavior/Discharge Planning: Goal: Ability to manage health-related needs will improve 04/14/2024 2121 by Lethia Raveling, RN Outcome: Progressing 04/14/2024 2116 by Lethia Raveling, RN Outcome: Progressing   Problem: Clinical Measurements: Goal: Ability to maintain clinical measurements within normal limits will improve 04/14/2024 2121 by Lethia Raveling, RN Outcome: Progressing 04/14/2024 2116 by Lethia Raveling, RN Outcome: Progressing Goal: Will remain free from infection 04/14/2024 2121 by Lethia Raveling, RN Outcome: Progressing 04/14/2024 2116 by Lethia Raveling, RN Outcome: Progressing Goal: Diagnostic test results will improve 04/14/2024 2121 by Lethia Raveling, RN Outcome: Progressing 04/14/2024 2116 by Lethia Raveling, RN Outcome: Progressing Goal: Respiratory complications will improve 04/14/2024 2121 by Lethia Raveling, RN Outcome:  Progressing 04/14/2024 2116 by Lethia Raveling, RN Outcome: Progressing Goal: Cardiovascular complication will be avoided 04/14/2024 2121 by Lethia Raveling, RN Outcome: Progressing 04/14/2024 2116 by Lethia Raveling, RN Outcome: Progressing   Problem: Activity: Goal: Risk for activity intolerance will decrease 04/14/2024 2121 by Lethia Raveling, RN Outcome: Progressing 04/14/2024 2116 by  Lethia Raveling, RN Outcome: Progressing   Problem: Nutrition: Goal: Adequate nutrition will be maintained 04/14/2024 2121 by Lethia Raveling, RN Outcome: Progressing 04/14/2024 2116 by Lethia Raveling, RN Outcome: Progressing   Problem: Coping: Goal: Level of anxiety will decrease 04/14/2024 2121 by Lethia Raveling, RN Outcome: Progressing 04/14/2024 2116 by Lethia Raveling, RN Outcome: Progressing   Problem: Elimination: Goal: Will not experience complications related to bowel motility 04/14/2024 2121 by Lethia Raveling, RN Outcome: Progressing 04/14/2024 2116 by Lethia Raveling, RN Outcome: Progressing Goal: Will not experience complications related to urinary retention 04/14/2024 2121 by Lethia Raveling, RN Outcome: Progressing 04/14/2024 2116 by Lethia Raveling, RN Outcome: Progressing   Problem: Pain Managment: Goal: General experience of comfort will improve and/or be controlled 04/14/2024 2121 by Lethia Raveling, RN Outcome: Progressing 04/14/2024 2116 by Lethia Raveling, RN Outcome: Progressing   Problem: Safety: Goal: Ability to remain free from injury will improve 04/14/2024 2121 by Lethia Raveling, RN Outcome: Progressing 04/14/2024 2116 by Lethia Raveling, RN Outcome: Progressing   Problem: Skin Integrity: Goal: Risk for impaired skin integrity will decrease 04/14/2024 2121 by Lethia Raveling, RN Outcome: Progressing 04/14/2024 2116 by Lethia Raveling, RN Outcome: Progressing   Problem: Education: Goal: Knowledge of General Education  information will improve Description: Including pain rating scale, medication(s)/side effects and non-pharmacologic comfort measures 04/14/2024 2121 by Lethia Raveling, RN Outcome: Progressing 04/14/2024 2116 by Lethia Raveling, RN Outcome: Progressing

## 2024-04-14 NOTE — Progress Notes (Signed)
 Physical Therapy Treatment Patient Details Name: Barry Sampson MRN: [id number] DOB: 02/16/1956 Today's Date: 04/14/2024   History of Present Illness Pt is a 68 y.o. male brought in by EMS after he was found wandering. Last hospitalized in 2015 for COPD exacerbation and has no regular healthcare. Daughter states that up until a couple weeks ago he was his normal self with no history of memory deficit, however over the past 2 weeks they noticed that he was forgetting stuff and on 1 occasion he found his glasses in the freezer. Fall ~4 days ago, presently oriented to self only. MRI negative. PMH of CAD s/p PCI, LBBB, systolic CHF (EF 40-98%), HTN, COPD, tobacco abuse, past history of cocaine abuse.    PT Comments  Pt was pleasant and motivated to participate during the session and put forth good effort throughout. Pt was independent coming to sitting EOB from supine with no physical assistance or use or bedrails. Pt completed STS from EOB independently with no physical assistance or AD. Pt ambulated 450 feet with supervision and constant VC's for attention and redirection as pt easily loses attention to task. Pt ascended and descended 8 steps utilizing R handrail with supervision as no LOBs were observed. Pt required CGA for safety during dynamic gait activities involving vertical and horizontal head turns, side stepping, and backwards walking. Pt required constant VC's to maintain attention to task and required demonstration of each DGA to execute each task properly. No LOB occurred during DGAs. Pt will benefit from continued PT services upon discharge to safely address deficits listed in patient problem list for decreased caregiver assistance and eventual return to PLOF.       If plan is discharge home, recommend the following: Assist for transportation;Help with stairs or ramp for entrance;Assistance with cooking/housework;Direct supervision/assist for financial management;Direct supervision/assist for  medications management;Supervision due to cognitive status   Can travel by private vehicle        Equipment Recommendations  None recommended by PT    Recommendations for Other Services       Precautions / Restrictions Precautions Precautions: Fall Recall of Precautions/Restrictions: Impaired Restrictions Weight Bearing Restrictions Per Provider Order: No     Mobility  Bed Mobility Overal bed mobility: Independent             General bed mobility comments: Pt able to come to sitting EOB from supine independently without physical assistance or use of bedrails    Transfers Overall transfer level: Needs assistance Equipment used: None Transfers: Sit to/from Stand Sit to Stand: Supervision           General transfer comment: Pt completed STS from EOB without AD or physical assistance    Ambulation/Gait Ambulation/Gait assistance: Supervision Gait Distance (Feet): 450 Feet Assistive device: None Gait Pattern/deviations: Step-through pattern Gait velocity: decreased     General Gait Details: Pt ambulated with supervision in hall without AD, CGA by PT was used for safety with dynamic gait activities involving vertical and horizontal head turns, side stepping, and backwards walking. Pt required constant VC's to maintain attention to task. Pt required demonstration of each DGA to execute each task properly. No LOB  occurred during DGAs.   Stairs Stairs: Yes Stairs assistance: Supervision Stair Management: One rail Right Number of Stairs: 8 General stair comments: Pt able to ascend and descend 8 steps while using R handrail with PT supervision. No LOBs observed   Wheelchair Mobility     Tilt Bed    Modified Rankin (Stroke Patients  Only)       Balance Overall balance assessment: Needs assistance Sitting-balance support: Feet supported Sitting balance-Leahy Scale: Normal Sitting balance - Comments: Pt able to maintain sitting balance at EOB without  physical assistance or UE support   Standing balance support: No upper extremity supported Standing balance-Leahy Scale: Good Standing balance comment: Pt able to maintain balance in standing without use of AD, but supervision required due to cognitive deficits                            Communication Communication Communication: Impaired Factors Affecting Communication: Hearing impaired  Cognition Arousal: Alert Behavior During Therapy: WFL for tasks assessed/performed   PT - Cognitive impairments: Attention, Awareness                       PT - Cognition Comments: PT required constant VC's during ambulation for direction Following commands: Impaired Following commands impaired: Follows one step commands inconsistently, Follows multi-step commands inconsistently    Cueing Cueing Techniques: Verbal cues, Tactile cues, Visual cues, Gestural cues  Exercises      General Comments        Pertinent Vitals/Pain Pain Assessment Pain Assessment: No/denies pain    Home Living                          Prior Function            PT Goals (current goals can now be found in the care plan section) Progress towards PT goals: Progressing toward goals    Frequency    Min 2X/week      PT Plan      Co-evaluation              AM-PAC PT 6 Clicks Mobility   Outcome Measure  Help needed turning from your back to your side while in a flat bed without using bedrails?: None Help needed moving from lying on your back to sitting on the side of a flat bed without using bedrails?: None Help needed moving to and from a bed to a chair (including a wheelchair)?: None Help needed standing up from a chair using your arms (e.g., wheelchair or bedside chair)?: None Help needed to walk in hospital room?: None Help needed climbing 3-5 steps with a railing? : A Little 6 Click Score: 23    End of Session Equipment Utilized During Treatment: Gait  belt Activity Tolerance: Patient tolerated treatment well Patient left: in chair;with call bell/phone within reach;with family/visitor present Nurse Communication: Mobility status, per nursing ok to leave with chair alarm off with family in room, daughter educated and understood to contact staff when she leaves  PT Visit Diagnosis: Unsteadiness on feet (R26.81);Difficulty in walking, not elsewhere classified (R26.2);Muscle weakness (generalized) (M62.81)     Time: 1610-9604 PT Time Calculation (min) (ACUTE ONLY): 21 min  Charges:                            Rhoda [facility], SPT 04/14/24, 2:32 PM

## 2024-04-14 NOTE — Progress Notes (Signed)
 PROGRESS NOTE    Barry Sampson  EAV:[id number] DOB: 11-Dec-1955 DOA: 04/06/2024 PCP: Pcp, No  Chief Complaint  Patient presents with   Altered Mental Status    Hospital Course:  Barry Sampson is 68 y.o. male with CAD status post PCI, left bundle branch block, systolic CHF with EF 25 to 30%, hypertension, COPD, tobacco abuse, history of cocaine abuse, who was brought in by EMS after he was found wandering.  His daughter reports that until 2 weeks prior to arrival he was at his mental status baseline.  2 weeks prior to arrival he began having gradual memory deficits including forgetting normal things and misplacing his items and sustained a fall.  In the ED blood pressure was 166/92, UDS positive for cocaine and cannabinoids, all other lab work unremarkable.  Head CT showed generalized cerebral atrophy with ventricular dilation but no acute changes.  He was admitted for further workup.  Neurology was consulted  Subjective: No acute changes overnight.  His daughter reports yesterday was a better day but he appears to have regressed some today. Patient has no acute complaints. Objective: Vitals:   04/13/24 1606 04/13/24 2122 04/14/24 0420 04/14/24 0821  BP: 124/68 119/82 (!) 118/47 (!) 140/85  Pulse: 68 70 63 (!) 58  Resp: 18 18 18 18   Temp: 98.4 F (36.9 C) 98.7 F (37.1 C) 98.7 F (37.1 C) (!) 97.3 F (36.3 C)  TempSrc:      SpO2: 95% 97% 95% 98%  Weight:      Height:        Intake/Output Summary (Last 24 hours) at 04/14/2024 1549 Last data filed at 04/13/2024 2145 Gross per 24 hour  Intake 290 ml  Output --  Net 290 ml   Filed Weights   04/06/24 1921  Weight: 88.5 kg    Examination: General exam: Appears calm and comfortable, NAD  Respiratory system: No work of breathing, symmetric chest wall expansion Cardiovascular system: S1 & S2 heard, RRR.  Gastrointestinal system: Abdomen is nondistended, soft and nontender.  Neuro: Alert, oriented to person and daughter,  disoriented to place and situation. Extremities: Symmetric, expected ROM Skin: No rashes, lesions Psychiatry: Demonstrates appropriate judgement and insight. Mood & affect appropriate for situation.   Assessment & Plan:  Principal Problem:   Acute metabolic encephalopathy Active Problems:   Hypoxia   Cocaine abuse (HCC)   COPD (chronic obstructive pulmonary disease) (HCC)   CAD S/P percutaneous coronary angioplasty   HTN (hypertension)   Tobacco use disorder   History of systolic CHF (congestive heart failure)   Encephalopathy, toxic versus metabolic.  Workup ongoing - Etiology remains unclear.  May be multifactorial. History of substance abuse. - CT scan without acute findings - Neurology consulted.  On their review of MRI there appeared to be some mild abnormalities as well as some abnormalities on CSF sampling.  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  Though he denies knowingly using fentanyl that is possible that there was unintentional overdose perhaps with altered cocaine.  Concerned that this is impacting hepatitis - Neurology also endorses possibility of inflammatory component and has initiated the patient on Solu-Medrol  1 g daily x 5 days.  Will continue with this through 6/15 - Status continues to wax and wane - No obvious sign of infectious etiology CSF autoimmune panel negative - Continue with Protonix  while on Solu-Medrol  - Appreciate further neurology recommendations - Continue close monitoring of mental status - Frequent reorientation - Delirium precautions - Continue  PT/OT - EEG consistent with generalized nonspecific cerebral dysfunction - CSF: Normal glucose, slightly elevated protein - Meningitis/encephalitis panel negative.  No organisms seen.  Culture negative - On empiric therapy with thiamine  and B12, continue - Please note: At daughter's request there were attempts made to transfer patient to [facility].  Neurology discussed  with previous attending that there is no indication for transfer given chronicity of changes on CT in the setting of acute drug use.  Hypoxia, resolved - Reportedly became hypoxic to 85% on room air following admission was placed on O2.  This has since resolved - Remains stable on room air - D-dimer 0.74 - CXR unremarkable  Tobacco use disorder - Continue nicotine patch  Hypertension - On lisinopril .  Daily monitoring.  Continue to titrate as needed.  At goal today.  CAD status post percutaneous coronary angioplasty Systolic congestive heart failure, EF 25 to 30% Left bundle branch block - No chest pain, dyspnea, acute changes on EKG. - Continue with lisinopril  - ECHO 25 to 30%, severely decreased left ventricular function, with hypokinesis, grade 1 diastolic dysfunction.  Aortic valve regurg mild. - Have counseled against drug abuse - Will need outpatient follow-up with cardiology  Hyperlipidemia - LDL 168, goal less than 100 - Continue statin - Follow-up outpatient with PCP in 3 to 6 months for cholesterol recheck  COPD, not currently in exacerbation - DuoNebs as needed  DVT prophylaxis: Heparin    Code Status: Full Code Disposition:  inpatient.  IV Steroids through 6/15 eventually home with home health  Consultants:    Procedures:  LP  Antimicrobials:  Anti-infectives (From admission, onward)    None       Data Reviewed: I have personally reviewed following labs and imaging studies CBC: Recent Labs  Lab 04/08/24 0457 04/09/24 0508 04/10/24 0258 04/11/24 0515 04/12/24 0538  WBC 7.0 8.3 8.2 7.7 15.9*  HGB 14.6 15.5 14.6 14.7 15.3  HCT 42.9 45.3 41.8 43.3 45.0  MCV 93.3 92.1 93.1 93.7 92.4  PLT 172 169 164 155 174   Basic Metabolic Panel: Recent Labs  Lab 04/08/24 0457 04/09/24 0508 04/10/24 0258 04/11/24 0515 04/12/24 0538  NA 139 138 138 139 137  K 3.7 3.8 3.6 3.8 4.1  CL 107 107 108 105 107  CO2 25 24 26 28  21*  GLUCOSE 112* 119* 124* 102*  137*  BUN 15 15 19 18 20   CREATININE 0.95 0.92 1.04 1.04 1.00  CALCIUM  8.7* 9.1 8.8* 9.0 9.2  MG 2.3 2.1 2.2  --   --   PHOS 3.3 2.9 4.0  --   --    GFR: Estimated Creatinine Clearance: 78.1 mL/min (by C-G formula based on SCr of 1 mg/dL). Liver Function Tests: Recent Labs  Lab 04/10/24 1539  ALBUMIN 4.0   CBG: No results for input(s): GLUCAP in the last 168 hours.  Recent Results (from the past 240 hours)  CSF culture w Gram Stain     Status: None   Collection Time: 04/10/24  3:39 PM   Specimen: CSF; Cerebrospinal Fluid  Result Value Ref Range Status   Specimen Description   Final    CSF Performed at [facility], [address] [place], [place] [address]    Special Requests   Final    NONE Performed at [facility], [address] [place], [place] [address]    Gram Stain   Final    NO ORGANISMS SEEN WBC SEEN RED BLOOD CELLS SEEN Performed at  [facility], [address], [place], [place] [address]    Culture   Final    NO GROWTH 3 DAYS Performed at [facility], [address] [place], [place] [address]    Report Status 04/14/2024 FINAL  Final  Culture, Fungus without Smear     Status: None (Preliminary result)   Collection Time: 04/10/24  3:39 PM   Specimen: PATH Cytology CSF; Cerebrospinal Fluid  Result Value Ref Range Status   Specimen Description   Final    CSF Performed at [facility], [address] [place], [place] [address]    Special Requests   Final    NONE Performed at [facility], [address], [place], [place] [address]    Culture   Final    NO GROWTH 4 DAYS Performed at [facility], [address] [place], [place] [address]    Report Status PENDING  Incomplete     Radiology Studies: No results found.   Scheduled Meds:  aspirin EC  81 mg Oral Daily   atorvastatin   40 mg Oral Daily   vitamin B-12  1,000 mcg Oral Daily   heparin  injection (subcutaneous)  5,000  Units Subcutaneous Q12H   lisinopril   10 mg Oral Daily   pantoprazole   40 mg Oral Daily   polyethylene glycol  17 g Oral Daily   thiamine   100 mg Oral Daily   Continuous Infusions:  methylPREDNISolone  (SOLU-MEDROL ) injection 1,000 mg (04/14/24 1202)     LOS: 7 days  MDM: Patient is high risk for one or more organ failure.  They necessitate ongoing hospitalization for continued IV therapies and subsequent lab monitoring. Total time spent interpreting labs and vitals, reviewing the medical record, coordinating care amongst consultants and care team members, directly assessing and discussing care with the patient and/or family: 55 min  Son Barkan, DO [facility]  To contact the attending physician between 7A-7P please use Epic Chat. To contact the covering physician during after hours 7P-7A, please review Amion.  04/14/2024, 3:49 PM   *This document has been created with the assistance of dictation software. Please excuse typographical errors. *

## 2024-04-14 NOTE — Plan of Care (Signed)

## 2024-04-14 NOTE — Progress Notes (Signed)
 Occupational Therapy Treatment Patient Details Name: LENELL LAMA MRN: [id number] DOB: 1956-07-09 Today's Date: 04/14/2024   History of present illness Pt is a 68 y.o. male brought in by EMS after he was found wandering. Last hospitalized in 2015 for COPD exacerbation and has no regular healthcare. Daughter states that up until a couple weeks ago he was his normal self with no history of memory deficit, however over the past 2 weeks they noticed that he was forgetting stuff and on 1 occasion he found his glasses in the freezer. Fall ~4 days ago, presently oriented to self only. MRI negative. PMH of CAD s/p PCI, LBBB, systolic CHF (EF 56-21%), HTN, COPD, tobacco abuse, past history of cocaine abuse.   OT comments  Upon entering the room, pt seated in recliner chair and agreeable to OT intervention. Pt is oriented to self only. He is agreeable to shower this session. IV covered by therapist to decrease infection risk. Pt asked to gather items needed for shower and unable to do so even with max multimodal cues and verbal guidance. Pt then enters bathroom and asked to undress and get into shower. He stands for ~ 10 minutes to initiate removing clothing with max cues. Pt needing max multimodal cues to sequence and initiate washing every part of body secondary to perseveration on washing UEs. He does dry himself and sits on EOB to dress self with minimal cues. Distractions limited in room for pt to attend to task and decrease external distractions. Pt does appear to be resource guarding in room and hiding candy. Call bell and all needed items within reach.       If plan is discharge home, recommend the following:  Supervision due to cognitive status;Direct supervision/assist for medications management;Direct supervision/assist for financial management;Assist for transportation;Assistance with cooking/housework   Equipment Recommendations  None recommended by OT       Precautions / Restrictions  Precautions Precautions: Fall Recall of Precautions/Restrictions: Impaired       Mobility Bed Mobility Overal bed mobility: Independent                  Transfers Overall transfer level: Needs assistance Equipment used: None Transfers: Sit to/from Stand Sit to Stand: Supervision           General transfer comment: Pt completed STS from EOB without AD or physical assistance     Balance Overall balance assessment: Needs assistance Sitting-balance support: Feet supported Sitting balance-Leahy Scale: Normal     Standing balance support: No upper extremity supported Standing balance-Leahy Scale: Fair                             ADL either performed or assessed with clinical judgement   ADL Overall ADL's : Needs assistance/impaired                                       General ADL Comments: supervision - CGA for balance for bathing in shower and dressing with max multimodal cues for initiation and sequencing of tasks.    Extremity/Trunk Assessment Upper Extremity Assessment Upper Extremity Assessment: Generalized weakness   Lower Extremity Assessment Lower Extremity Assessment: Generalized weakness        Vision Patient Visual Report: No change from baseline           Communication Communication Communication: Impaired Factors Affecting Communication: Hearing  impaired   Cognition Arousal: Alert Behavior During Therapy: WFL for tasks assessed/performed Cognition: Cognition impaired           Executive functioning impairment (select all impairments): Initiation, Organization, Sequencing, Reasoning, Problem solving                   Following commands: Impaired Following commands impaired: Follows one step commands inconsistently, Follows multi-step commands inconsistently      Cueing   Cueing Techniques: Verbal cues, Tactile cues, Visual cues, Gestural cues             Pertinent Vitals/ Pain       Pain  Assessment Pain Assessment: No/denies pain         Frequency  Min 1X/week        Progress Toward Goals  OT Goals(current goals can now be found in the care plan section)  Progress towards OT goals: Progressing toward goals      AM-PAC OT 6 Clicks Daily Activity     Outcome Measure   Help from another person eating meals?: None Help from another person taking care of personal grooming?: None Help from another person toileting, which includes using toliet, bedpan, or urinal?: A Little Help from another person bathing (including washing, rinsing, drying)?: A Little Help from another person to put on and taking off regular upper body clothing?: None Help from another person to put on and taking off regular lower body clothing?: None 6 Click Score: 22    End of Session    OT Visit Diagnosis: Other abnormalities of gait and mobility (R26.89)   Activity Tolerance Patient tolerated treatment well   Patient Left with call bell/phone within reach;with family/visitor present;in bed   Nurse Communication Mobility status        Time: 1660-6301 OT Time Calculation (min): 41 min  Charges: OT General Charges $OT Visit: 1 Visit OT Treatments $Self Care/Home Management : 38-52 mins  George Kinder, MS, OTR/L , CBIS ascom [phone]  04/14/24, 3:23 PM

## 2024-04-15 DIAGNOSIS — R0902 Hypoxemia: Secondary | ICD-10-CM | POA: Diagnosis not present

## 2024-04-15 DIAGNOSIS — I158 Other secondary hypertension: Secondary | ICD-10-CM | POA: Diagnosis not present

## 2024-04-15 DIAGNOSIS — G9341 Metabolic encephalopathy: Secondary | ICD-10-CM | POA: Diagnosis not present

## 2024-04-15 DIAGNOSIS — R9089 Other abnormal findings on diagnostic imaging of central nervous system: Secondary | ICD-10-CM | POA: Diagnosis not present

## 2024-04-15 DIAGNOSIS — R839 Unspecified abnormal finding in cerebrospinal fluid: Secondary | ICD-10-CM | POA: Diagnosis not present

## 2024-04-15 DIAGNOSIS — R413 Other amnesia: Secondary | ICD-10-CM | POA: Diagnosis not present

## 2024-04-15 DIAGNOSIS — F1410 Cocaine abuse, uncomplicated: Secondary | ICD-10-CM | POA: Diagnosis not present

## 2024-04-15 LAB — CBC WITH DIFFERENTIAL/PLATELET
Abs Immature Granulocytes: 0.06 10*3/uL (ref 0.00–0.07)
Basophils Absolute: 0 10*3/uL (ref 0.0–0.1)
Basophils Relative: 0 %
Eosinophils Absolute: 0 10*3/uL (ref 0.0–0.5)
Eosinophils Relative: 0 %
HCT: 41.9 % (ref 39.0–52.0)
Hemoglobin: 14.7 g/dL (ref 13.0–17.0)
Immature Granulocytes: 1 %
Lymphocytes Relative: 10 %
Lymphs Abs: 1.3 10*3/uL (ref 0.7–4.0)
MCH: 32.3 pg (ref 26.0–34.0)
MCHC: 35.1 g/dL (ref 30.0–36.0)
MCV: 92.1 fL (ref 80.0–100.0)
Monocytes Absolute: 0.4 10*3/uL (ref 0.1–1.0)
Monocytes Relative: 3 %
Neutro Abs: 11.2 10*3/uL — ABNORMAL HIGH (ref 1.7–7.7)
Neutrophils Relative %: 86 %
Platelets: 174 10*3/uL (ref 150–400)
RBC: 4.55 MIL/uL (ref 4.22–5.81)
RDW: 13 % (ref 11.5–15.5)
WBC: 13 10*3/uL — ABNORMAL HIGH (ref 4.0–10.5)
nRBC: 0 % (ref 0.0–0.2)

## 2024-04-15 LAB — COMPREHENSIVE METABOLIC PANEL WITH GFR
ALT: 31 U/L (ref 0–44)
AST: 24 U/L (ref 15–41)
Albumin: 3.7 g/dL (ref 3.5–5.0)
Alkaline Phosphatase: 29 U/L — ABNORMAL LOW (ref 38–126)
Anion gap: 9 (ref 5–15)
BUN: 24 mg/dL — ABNORMAL HIGH (ref 8–23)
CO2: 24 mmol/L (ref 22–32)
Calcium: 8.9 mg/dL (ref 8.9–10.3)
Chloride: 107 mmol/L (ref 98–111)
Creatinine, Ser: 0.94 mg/dL (ref 0.61–1.24)
GFR, Estimated: >60 mL/min (ref >60)
Glucose, Bld: 139 mg/dL — ABNORMAL HIGH (ref 70–99)
Potassium: 3.9 mmol/L (ref 3.5–5.1)
Sodium: 140 mmol/L (ref 135–145)
Total Bilirubin: 0.6 mg/dL (ref 0.0–1.2)
Total Protein: 6.9 g/dL (ref 6.5–8.1)

## 2024-04-15 MED ORDER — VITAMIN B-1 100 MG PO TABS: 100.0000 mg | ORAL_TABLET | Freq: Every day | ORAL | 0 refills | Status: AC

## 2024-04-15 MED ORDER — CYANOCOBALAMIN 1000 MCG PO TABS: 1000.0000 ug | ORAL_TABLET | Freq: Every day | ORAL | 0 refills | Status: AC

## 2024-04-15 MED ORDER — ASPIRIN 81 MG PO TBEC
81.0000 mg | DELAYED_RELEASE_TABLET | Freq: Every day | ORAL | 0 refills | Status: AC
Start: 2024-04-16 — End: ?

## 2024-04-15 MED ORDER — ATORVASTATIN CALCIUM 40 MG PO TABS: 40.0000 mg | ORAL_TABLET | Freq: Every day | ORAL | 0 refills | Status: AC

## 2024-04-15 MED ORDER — LISINOPRIL 20 MG PO TABS: 20.0000 mg | ORAL_TABLET | Freq: Every day | ORAL | 0 refills | Status: AC

## 2024-04-15 NOTE — Progress Notes (Signed)
 NEUROLOGY CONSULT FOLLOW UP NOTE   Date of service: April 15, 2024 Patient Name: Barry Sampson MRN:  [id number] DOB:  1955/11/09  Interval Hx/subjective   No significant changes  Vitals   Vitals:   04/14/24 1655 04/14/24 2047 04/15/24 0525 04/15/24 0818  BP: 127/88 136/76 (!) 150/74 (!) 150/84  Pulse: 80 62 72 73  Resp: 16 16  18   Temp: 98.2 F (36.8 C) 98.6 F (37 C) 98.2 F (36.8 C) 98.4 F (36.9 C)  TempSrc:    Oral  SpO2: 98% 94% 96% 93%  Weight:      Height:         Body mass index is 27.98 kg/m.  Physical Exam   Constitutional: Appears well-developed and well-nourished.  Neurologic Examination    MS: Awake, alert, unable to give the month or year.  He is able to tell me that he is at [facility].  He is unable to give me # of quarters in $2.75  CN: EOMI, visual fields full Motor: MAEW Sensory: Intact to light touch No ataxia on finger-nose-finger  Medications  Current Facility-Administered Medications:    acetaminophen  (TYLENOL ) tablet 650 mg, 650 mg, Oral, Q4H PRN, 650 mg at 04/11/24 1038 **OR** acetaminophen  (TYLENOL ) 160 MG/5ML solution 650 mg, 650 mg, Per Tube, Q4H PRN **OR** acetaminophen  (TYLENOL ) suppository 650 mg, 650 mg, Rectal, Q4H PRN, Lanetta Pion, MD   aspirin EC tablet 81 mg, 81 mg, Oral, Daily, Augustin Leber, MD, 81 mg at 04/15/24 0919   atorvastatin  (LIPITOR) tablet 40 mg, 40 mg, Oral, Daily, Althia Atlas, MD, 40 mg at 04/15/24 0919   cyanocobalamin  (VITAMIN B12) tablet 1,000 mcg, 1,000 mcg, Oral, Daily, Bhagat, Srishti L, MD, 1,000 mcg at 04/15/24 0919   heparin  injection 5,000 Units, 5,000 Units, Subcutaneous, Q12H, Dezii, Alexandra, DO, 5,000 Units at 04/15/24 0918   lisinopril  (ZESTRIL ) tablet 10 mg, 10 mg, Oral, Daily, Agbata, Tochukwu, MD, 10 mg at 04/15/24 0919   methylPREDNISolone  sodium succinate (SOLU-MEDROL ) 1,000 mg in sodium chloride  0.9 % 50 mL IVPB, 1,000 mg, Intravenous, Q24H, Augustin Leber, MD, Last Rate: 66 mL/hr at 04/14/24 1202, 1,000 mg at 04/14/24 1202   pantoprazole  (PROTONIX ) EC tablet 40 mg, 40 mg, Oral, Daily, Augustin Leber, MD, 40 mg at 04/15/24 0919   polyethylene glycol (MIRALAX  / GLYCOLAX ) packet 17 g, 17 g, Oral, Daily, Agbata, Tochukwu, MD, 17 g at 04/15/24 0918   thiamine  (VITAMIN B1) tablet 100 mg, 100 mg, Oral, Daily, Bhagat, Srishti L, MD, 100 mg at 04/15/24 0919  Labs and Diagnostic Imaging   CSF WBC eight CSF RBC zero Csf protein 49 IgG index normal.   Imaging(Personally reviewed): Two MRIs with subtle T2/diffusion change in the left medial temporal lobe.  Assessment   Barry Sampson is a 68 y.o. male with abrupt onset cognitive/memory issues.  On discussion with his daughters, he had no antecedent cognitive issues per their report.  He continues to be quite impaired and has mildly abnormal MRI and CSF.  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  He did not knowingly use  fentanyl, but this is becoming a more common contaminant.  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  I do not think, at this point, with his CSF results that infection is at all likely.  He has no episodic symptomatology to suggest seizure  activity, and EEG is negative.  I continue to suspect toxic etiology given the abrupt onset.  That being said, medial hippocampal change with mildly abnormal CSF findings could be suggestive of an inflammatory component as well and will take some time to get the CSF autoimmune panel back.  Based on this, we decided to proceed with an empiric trial of Solu-Medrol  which has had no response.  With no steroid response as well as negative serum autoimmune panel, I think that an autoimmune process is considerably less likely.  Ischemia, either as a result of cocaine vasospasm or ischemic stroke I  think are less likely given the appearance on MRI, but given the lack of other concrete explanation, he has been continued on aspirin as well as statin with goal LDL less than 70.    Recommendations  At this point, I would not pursue any further immunosuppression unless the patient were to have concrete evidence on his CSF autoimmune panel Care at this point is supportive with therapy, no further testing that I feel would be likely to give us  more information at this time. Neurology will continue to be available as needed, please call with further questions or concerns.  ______________________________________________________________________   Flint Hummer, MD [facility]

## 2024-04-15 NOTE — TOC Transition Note (Signed)
 Transition of Care [facility]) - Discharge Note   Patient Details  Name: Barry Sampson MRN: [id number] Date of Birth: [birth year]  Transition of Care [facility]) CM/SW Contact:  Crayton Docker, RN 04/15/2024, 3:57 PM   Clinical Narrative:     Discharge orders noted for home health PT/OT. [facility] following for home health PT/OT services. Private transportation arranged. CM call to [facility], phone: [phone] regarding pending discharge. No answer, CM left message for return call. CM secure message to [facility] regarding pending discharge.   Final next level of care: Home w Home Health Services Barriers to Discharge: No Barriers Identified   Patient Goals and CMS Choice    Home with home health PT/OT  Discharge Placement    Home with home health PT/OT            Discharge Plan and Services Additional resources added to the After Visit Summary for      [facility] Arranged: PT, OT HH Agency: [facility] Date [facility] Agency Contacted: 04/14/24 Time HH Agency Contacted: 1051 Representative spoke with at [facility] Agency: Verdis Glade  Social Drivers of Health (SDOH) Interventions SDOH Screenings   Food Insecurity: No Food Insecurity (04/07/2024)  Housing: Low Risk  (04/07/2024)  Transportation Needs: No Transportation Needs (04/07/2024)  Utilities: Not At Risk (04/07/2024)  Social Connections: Patient Declined (04/07/2024)  Tobacco Use: High Risk (04/07/2024)     Readmission Risk Interventions     No data to display

## 2024-04-15 NOTE — Discharge Summary (Signed)
 Physician Discharge Summary   Patient: Barry Sampson MRN: [id number] DOB: Jul 28, 1956  Admit date:     04/06/2024  Discharge date: 04/15/24  Discharge Physician: Roise Cleaver   PCP: Pcp, No   Recommendations at discharge:    Follow up with Neurology in the clinic See PCP for chronic medication management  Discharge Diagnoses: Principal Problem:   Acute metabolic encephalopathy Active Problems:   Hypoxia   Cocaine abuse (HCC)   COPD (chronic obstructive pulmonary disease) (HCC)   CAD S/P percutaneous coronary angioplasty   HTN (hypertension)   Tobacco use disorder   History of systolic CHF (congestive heart failure)  Resolved Problems:   * No resolved hospital problems. *  Hospital Course: Barry Sampson is 68 y.o. male with CAD status post PCI, left bundle branch block, systolic CHF with EF 25 to 30%, hypertension, COPD, tobacco abuse, history of cocaine abuse, who was brought in by EMS after he was found wandering.  His daughter reports that until 2 weeks prior to arrival he was at his mental status baseline.  2 weeks prior to arrival he began having gradual memory deficits including forgetting normal things and misplacing his items and sustained a fall.  In the ED blood pressure was 166/92, UDS positive for cocaine and cannabinoids, all other lab work unremarkable.  Head CT showed generalized cerebral atrophy with ventricular dilation but no acute changes.  He was admitted for further workup.  Neurology was consulted.  Patient underwent extensive workup, as detailed below. Ultimately was determined this is likely toxic etiology given the abrupt onset.  There is possibility of cocaine vasospasm or ischemic stroke and thus he was also continued on aspirin and statin.  At time of discharge patient remains disoriented.  He does not have capacity to make decisions.  I have discussed this extensively with his daughter.  She may pursue competency eval outpatient as warranted.  In the  interim she ensures that he will have 24/7 supervision at home.  Home health services were ordered prior to discharge  Encephalopathy, toxic versus metabolic.  Workup ongoing - Etiology remains unclear.  May be multifactorial. History of substance abuse. - CT scan without acute findings - Neurology consulted.  On their review of MRI there appeared to be some mild abnormalities as well as some abnormalities on CSF sampling.  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  Though he denies knowingly using fentanyl that is possible that there was unintentional overdose perhaps with altered cocaine.   - Neurology also endorsed possibility of inflammatory component and has initiated the patient on Solu-Medrol  1 g daily x 5 days.  Patient can continue this course through 6/14 but had minimal improvement.  CSF autoimmune panel was ultimately negative. - Neurology reports though it is unlikely there is still possibility of cocaine vasospasm or ischemic stroke and has recommended to continue with aspirin and statin at discharge - Mental status continues to wax and wane - No obvious sign of infectious etiology --Patient will have direct supervision 24/7 at home.  Home health PT/OT ordered. - EEG consistent with generalized nonspecific cerebral dysfunction - CSF: Normal glucose, slightly elevated protein - Meningitis/encephalitis panel negative.  No organisms seen.  Culture negative - On empiric therapy with thiamine  and B12, continue - Please note: At daughter's request there were attempts made to transfer patient to [facility].  Neurology discussed with previous attending that there is no indication for transfer given chronicity of changes on CT in  the setting of acute drug use. - At time of discharge patient does not have capacity to make medical decisions.  I have provided note to the patient's daughter stating such.   Hypoxia, resolved - Reportedly became hypoxic to 85% on room  air following admission was placed on O2.  This has since resolved - Remains stable on room air - D-dimer 0.74 - CXR unremarkable   Tobacco use disorder - Nicotine patch   Hypertension - Have initiated lisinopril .  Continue at discharge.   CAD status post percutaneous coronary angioplasty Systolic congestive heart failure, EF 25 to 30% Left bundle branch block - No chest pain, dyspnea, acute changes on EKG. - Continue with lisinopril  - ECHO 25 to 30%, severely decreased left ventricular function, with hypokinesis, grade 1 diastolic dysfunction.  Aortic valve regurg mild. - Have counseled against drug abuse - Will need outpatient follow-up with cardiology   Hyperlipidemia - LDL 168, goal less than 100 - Continue statin - Follow-up outpatient with PCP in 3 to 6 months for cholesterol recheck   COPD, not currently in exacerbation - DuoNebs as needed  BMI 27      Consultants: Neurology Procedures performed: LP  Disposition: Home health Diet recommendation:  Discharge Diet Orders (From admission, onward)     Start     Ordered   04/15/24 0000  Diet general        04/15/24 1427           Regular diet DISCHARGE MEDICATION: Allergies as of 04/15/2024   No Known Allergies      Medication List     STOP taking these medications    amoxicillin  500 MG capsule Commonly known as: AMOXIL    HYDROcodone -acetaminophen  5-325 MG tablet Commonly known as: NORCO/VICODIN   ofloxacin  0.3 % ophthalmic solution Commonly known as: OCUFLOX        TAKE these medications    aspirin EC 81 MG tablet Take 1 tablet (81 mg total) by mouth daily. Swallow whole. Start taking on: April 16, 2024   atorvastatin  40 MG tablet Commonly known as: LIPITOR Take 1 tablet (40 mg total) by mouth daily. Start taking on: April 16, 2024   cyanocobalamin  1000 MCG tablet Take 1 tablet (1,000 mcg total) by mouth daily. Start taking on: April 16, 2024   lisinopril  20 MG tablet Commonly known  as: ZESTRIL  Take 1 tablet (20 mg total) by mouth daily. Start taking on: April 16, 2024   thiamine  100 MG tablet Commonly known as: Vitamin B-1 Take 1 tablet (100 mg total) by mouth daily. Start taking on: April 16, 2024        Follow-up Information     [facility] Follow up.   Specialty: Home Health Services Why: 06/13---[place] will follow for home health PT/OT Contact information: [address] [place] [address] [phone]                Discharge Exam: Cleavon Curls Weights   04/06/24 1921  Weight: 88.5 kg   General exam: Appears calm and comfortable, NAD  Respiratory system: No work of breathing, symmetric chest wall expansion Cardiovascular system: S1 & S2 heard, RRR.  Gastrointestinal system: Abdomen is nondistended, soft and nontender.  Neuro: Alert, oriented to self and daughter, disoriented to place and situation. Cannot recall information we discussed on prior visits.  Extremities: Symmetric, expected ROM Skin: No rashes, lesions Psychiatry: Mood & affect appropriate for situation.   Condition at discharge: stable  The results of significant diagnostics from this hospitalization (including imaging, microbiology, ancillary and laboratory) are listed below for reference.   Imaging Studies: DG FL GUIDED LUMBAR PUNCTURE Result Date: 04/10/2024 CLINICAL DATA:  68 year old male with new onset altered mental status for the past 2 weeks. EXAM: LUMBAR PUNCTURE UNDER FLUOROSCOPY PROCEDURE: The risks (including hemorrhage, infection, and headache, among others), benefits, and alternatives to fluoroscopically guided lumbar puncture was discussed with the patient's daughter. The patient is unable to consent himself due to altered mental status. The patient's daughter understood and elected for the patient to undergo the procedure. Standard time-out was employed. An appropriate skin entry site was determined fluoroscopically. Operator  donned sterile gloves and mask. Skin site was marked, then prepped with Betadine, draped in usual sterile fashion, and infiltrated locally with 1% lidocaine . A 20 gauge spinal needle advanced into the thecal sac at L2-3 from a left interlaminar approach. Clear colorless CSF spontaneously returned, with opening pressure of 14 cm water. 14 ml CSF were collected and divided among 4 sterile vials for the requested laboratory studies. The needle was then removed. The patient tolerated the procedure well and there were no complications. FLUOROSCOPY: Radiation Exposure Index (as provided by the fluoroscopic device): 13.0 mGy Kerma IMPRESSION: Technically successful lumbar puncture under fluoroscopy. This exam was performed by [facility] PA-C, and was supervised and interpreted by Dr. Robby Chime. Electronically Signed   By: Freida Jes M.D.   On: 04/10/2024 16:33   CT CHEST ABDOMEN PELVIS W CONTRAST Result Date: 04/09/2024 CLINICAL DATA:  Occult malignancy.  * Tracking Code: BO * EXAM: CT CHEST, ABDOMEN, AND PELVIS WITH CONTRAST TECHNIQUE: Multidetector CT imaging of the chest, abdomen and pelvis was performed following the standard protocol during bolus administration of intravenous contrast. RADIATION DOSE REDUCTION: This exam was performed according to the departmental dose-optimization program which includes automated exposure control, adjustment of the mA and/or kV according to patient size and/or use of iterative reconstruction technique. CONTRAST:  OMNIPAQUE  IOHEXOL  300 MG/ML  SOLN COMPARISON:  None Available. FINDINGS: CT CHEST FINDINGS Cardiovascular: Normal cardiac size. No pericardial effusion. No aortic aneurysm. There are coronary artery calcifications, in keeping with coronary artery disease. There are also mild peripheral atherosclerotic vascular calcifications of thoracic aorta and its major branches. Mediastinum/Nodes: Visualized thyroid gland appears grossly unremarkable. No solid  / cystic mediastinal masses. The esophagus is nondistended precluding optimal assessment. There are few mildly prominent mediastinal lymph nodes, which do not meet the size criteria for lymphadenopathy and though indeterminate most likely benign in etiology. No axillary or hilar lymphadenopathy by size criteria. Lungs/Pleura: The central tracheo-bronchial tree is patent. There is mild, smooth, circumferential thickening of the segmental and subsegmental bronchial walls, throughout bilateral lungs, which is nonspecific. Findings are most commonly seen with bronchitis or reactive airway disease, such as asthma. Mild upper lobe predominant paraseptal emphysematous changes noted. No mass or consolidation. No pleural effusion or pneumothorax. No suspicious lung nodules. Musculoskeletal: The visualized soft tissues of the chest wall are grossly unremarkable. No suspicious osseous lesions. There are mild multilevel degenerative changes in the visualized spine. CT ABDOMEN PELVIS FINDINGS Evaluation of abdomen and pelvis is mild-to-moderately degraded by patient's motion during data acquisition. Hepatobiliary: The liver is normal in size. Non-cirrhotic configuration. No suspicious mass. These is mild diffuse hepatic steatosis. No intrahepatic or extrahepatic bile duct dilation. Contracted gallbladder. No calcified gallstones. Normal gallbladder wall thickness. No pericholecystic inflammatory changes. Pancreas: Unremarkable. No pancreatic ductal dilatation or surrounding inflammatory changes. Spleen: Within normal  limits. No focal lesion. Adrenals/Urinary Tract: Adrenal glands are unremarkable. No suspicious renal mass. No hydronephrosis. No renal or ureteric calculi. Unremarkable urinary bladder. Stomach/Bowel: No disproportionate dilation of the small or large bowel loops. No evidence of abnormal bowel wall thickening or inflammatory changes. The appendix is unremarkable. Vascular/Lymphatic: No ascites or pneumoperitoneum.  No abdominal or pelvic lymphadenopathy, by size criteria. No aneurysmal dilation of the major abdominal arteries. There are moderate peripheral atherosclerotic vascular calcifications of the aorta and its major branches. Reproductive: Normal size prostate. Symmetric seminal vesicles. Other: There are small fat containing umbilical and right inguinal hernias. The soft tissues and abdominal wall are otherwise unremarkable. Musculoskeletal: No suspicious osseous lesions. There are mild multilevel degenerative changes in the visualized spine. IMPRESSION: 1. No metastatic disease identified within the chest, abdomen or pelvis. 2. Multiple other nonacute observations, as described above. Aortic Atherosclerosis (ICD10-I70.0) and Emphysema (ICD10-J43.9). Electronically Signed   By: Beula Brunswick M.D.   On: 04/09/2024 17:37   MR BRAIN W WO CONTRAST Result Date: 04/09/2024 EXAM: MRI BRAIN WITHOUT AND WITH CONTRAST 04/09/2024 01:39:00 PM TECHNIQUE: Multiplanar multisequence MRI of the head/brain was performed with and without the administration of 7.5 mL of intravenous gadobutrol  (GADAVIST ). COMPARISON: MR head without contrast 04/07/2024. CLINICAL HISTORY: Mental status change, persistent or worsening; Query subtle left hippocampal abnormality on initial MRI, please follow-up. FINDINGS: BRAIN AND VENTRICLES: No acute infarct. No intracranial hemorrhage. No mass. No midline shift. No hydrocephalus. The sella is unremarkable. Normal flow voids. Subtle diffusion and FLAIR signal hyperintensity are present within the medial left hippocampus. No pathologic enhancement is present. ORBITS: No acute abnormality. SINUSES AND MASTOIDS: No acute abnormality. BONES AND SOFT TISSUES: Normal marrow signal. No acute soft tissue abnormality. LIMITATIONS/ARTIFACTS: The study is mildly degraded by patient motion. IMPRESSION: 1. Subtle diffusion and FLAIR signal hyperintensity within the medial left hippocampus, without pathologic  enhancement. This may be secondary to seizure activity. Infarct is considered less likely. 2. Mildly degraded by patient motion. 3. No acute intracranial abnormality. The pertinent results were texted to Dr. Burnice Carton at via the [facility] system at 4:14 pm . Electronically signed by: Audree Leas MD 04/09/2024 04:14 PM EDT RP Workstation: [id number]   ECHOCARDIOGRAM COMPLETE Result Date: 04/08/2024    ECHOCARDIOGRAM REPORT   Patient Name:   RAYMONT ANDREONI Date of Exam: 04/08/2024 Medical Rec #:  [id number]        Height:       70.0 in Accession #:    [id number]       Weight:       195.0 lb Date of Birth:  11/13/1955       BSA:          2.065 m Patient Age:    67 years         BP:           150/93 mmHg Patient Gender: M                HR:           68 bpm. Exam Location:  [facility] Procedure: 2D Echo, Cardiac Doppler, Color Doppler and Strain Analysis (Both            Spectral and Color Flow Doppler were utilized during procedure). Indications:     Stroke I63.9  History:         Patient has no prior history of Echocardiogram examinations.  CAD.  Sonographer:     Kathaleen Pale Roar Referring Phys:  [id number] Lanetta Pion Diagnosing Phys: Joetta Mustache  Sonographer Comments: Global longitudinal strain was attempted. IMPRESSIONS  1. Left ventricular ejection fraction, by estimation, is 25 to 30%. The left ventricle has severely decreased function. The left ventricle demonstrates regional wall motion abnormalities (see scoring diagram/findings for description). There is mild left  ventricular hypertrophy. Left ventricular diastolic parameters are consistent with Grade I diastolic dysfunction (impaired relaxation).  2. Right ventricular systolic function is normal. The right ventricular size is normal.  3. The mitral valve is normal in structure. Trivial mitral valve regurgitation.  4. The aortic valve is tricuspid. Aortic valve regurgitation is mild.  5. The inferior vena cava is normal in size with greater than  50% respiratory variability, suggesting right atrial pressure of 3 mmHg. FINDINGS  Left Ventricle: Left ventricular ejection fraction, by estimation, is 25 to 30%. The left ventricle has severely decreased function. The left ventricle demonstrates regional wall motion abnormalities. The left ventricular internal cavity size was normal  in size. There is mild left ventricular hypertrophy. Left ventricular diastolic parameters are consistent with Grade I diastolic dysfunction (impaired relaxation).  LV Wall Scoring: The mid anteroseptal segment and mid inferoseptal segment are akinetic. The entire anterior wall, entire apex, entire inferior wall, basal anteroseptal segment, and basal inferoseptal segment are hypokinetic. The antero-lateral wall and posterior wall are normal. Right Ventricle: The right ventricular size is normal. No increase in right ventricular wall thickness. Right ventricular systolic function is normal. Left Atrium: Left atrial size was normal in size. Right Atrium: Right atrial size was normal in size. Pericardium: There is no evidence of pericardial effusion. Mitral Valve: The mitral valve is normal in structure. Trivial mitral valve regurgitation. MV peak gradient, 3.4 mmHg. The mean mitral valve gradient is 1.0 mmHg. Tricuspid Valve: The tricuspid valve is normal in structure. Tricuspid valve regurgitation is mild. Aortic Valve: The aortic valve is tricuspid. Aortic valve regurgitation is mild. Aortic regurgitation PHT measures 681 msec. Aortic valve mean gradient measures 6.0 mmHg. Aortic valve peak gradient measures 11.6 mmHg. Aortic valve area, by VTI measures 1.54 cm. Pulmonic Valve: The pulmonic valve was not well visualized. Pulmonic valve regurgitation is not visualized. Aorta: The aortic root and ascending aorta are structurally normal, with no evidence of dilitation. Venous: The inferior vena cava is normal in size with greater than 50% respiratory variability, suggesting right atrial  pressure of 3 mmHg. IAS/Shunts: The atrial septum is grossly normal.  LEFT VENTRICLE PLAX 2D LVIDd:         4.90 cm      Diastology LVIDs:         4.30 cm      LV e' medial:    6.85 cm/s LV PW:         1.00 cm      LV E/e' medial:  10.1 LV IVS:        1.20 cm      LV e' lateral:   8.38 cm/s LVOT diam:     1.70 cm      LV E/e' lateral: 8.3 LV SV:         41 LV SV Index:   20 LVOT Area:     2.27 cm  LV Volumes (MOD) LV vol d, MOD A2C: 133.0 ml LV vol d, MOD A4C: 105.0 ml LV vol s, MOD A2C: 93.8 ml LV vol s, MOD A4C: 76.8 ml LV SV MOD A2C:  39.2 ml LV SV MOD A4C:     105.0 ml LV SV MOD BP:      29.5 ml RIGHT VENTRICLE RV Basal diam:  2.70 cm RV Mid diam:    2.50 cm RV S prime:     13.80 cm/s TAPSE (M-mode): 2.0 cm LEFT ATRIUM             Index        RIGHT ATRIUM           Index LA diam:        3.70 cm 1.79 cm/m   RA Area:     12.90 cm LA Vol (A2C):   56.9 ml 27.55 ml/m  RA Volume:   29.90 ml  14.48 ml/m LA Vol (A4C):   36.4 ml 17.63 ml/m LA Biplane Vol: 46.6 ml 22.57 ml/m  AORTIC VALVE                     PULMONIC VALVE AV Area (Vmax):    1.34 cm      PV Vmax:        1.48 m/s AV Area (Vmean):   1.39 cm      PV Peak grad:   8.8 mmHg AV Area (VTI):     1.54 cm      RVOT Peak grad: 8 mmHg AV Vmax:           170.00 cm/s AV Vmean:          109.000 cm/s AV VTI:            0.264 m AV Peak Grad:      11.6 mmHg AV Mean Grad:      6.0 mmHg LVOT Vmax:         100.00 cm/s LVOT Vmean:        66.800 cm/s LVOT VTI:          0.179 m LVOT/AV VTI ratio: 0.68 AI PHT:            681 msec  AORTA Ao Root diam: 2.70 cm Ao Asc diam:  3.50 cm MITRAL VALVE MV Area (PHT): 2.87 cm     SHUNTS MV Area VTI:   1.76 cm     Systemic VTI:  0.18 m MV Peak grad:  3.4 mmHg     Systemic Diam: 1.70 cm MV Mean grad:  1.0 mmHg MV Vmax:       0.92 m/s MV Vmean:      49.5 cm/s MV Decel Time: 264 msec MV E velocity: 69.40 cm/s MV A velocity: 107.00 cm/s MV E/A ratio:  0.65 MV A Prime:    10.1 cm/s Joetta Mustache Electronically signed by Joetta Mustache Signature Date/Time: 04/08/2024/3:37:23 PM    Final    MR BRAIN WO CONTRAST Result Date: 04/07/2024 CLINICAL DATA:  Neuro deficit, acute, stroke suspected EXAM: MRI HEAD WITHOUT CONTRAST TECHNIQUE: Multiplanar, multiecho pulse sequences of the brain and surrounding structures were obtained without intravenous contrast. COMPARISON:  CT head June 5, 25. FINDINGS: Brain: No acute infarction, hemorrhage, hydrocephalus, extra-axial collection or mass lesion. Vascular: Normal flow voids. Skull and upper cervical spine: Normal marrow signal. Sinuses/Orbits: Negative. IMPRESSION: No evidence of acute intracranial abnormality. Electronically Signed   By: Stevenson Elbe M.D.   On: 04/07/2024 02:04   DG Chest 1 View Result Date: 04/06/2024 CLINICAL DATA:  Altered mental status. EXAM: CHEST  1 VIEW COMPARISON:  02/17/2016 FINDINGS: The heart size and mediastinal contours are within normal limits.  Dependent bibasilar linear subsegmental atelectasis or scarring. No pneumonia or pulmonary edema. No pneumothorax or pleural effusion. The visualized skeletal structures are unremarkable. IMPRESSION: No active disease. Electronically Signed   By: Sydell Eva M.D.   On: 04/06/2024 20:14   CT Head Wo Contrast Result Date: 04/06/2024 CLINICAL DATA:  Altered mental status. EXAM: CT HEAD WITHOUT CONTRAST TECHNIQUE: Contiguous axial images were obtained from the base of the skull through the vertex without intravenous contrast. RADIATION DOSE REDUCTION: This exam was performed according to the departmental dose-optimization program which includes automated exposure control, adjustment of the mA and/or kV according to patient size and/or use of iterative reconstruction technique. COMPARISON:  None Available. FINDINGS: Brain: There is mild cerebral atrophy with widening of the extra-axial spaces and ventricular dilatation. There are areas of decreased attenuation within the white matter tracts of the supratentorial brain,  consistent with microvascular disease changes. Vascular: Mild to moderate severity bilateral cavernous carotid artery calcification is noted. Skull: Normal. Negative for fracture or focal lesion. Sinuses/Orbits: No acute finding. Other: None. IMPRESSION: 1. No acute intracranial abnormality. 2. Generalized cerebral atrophy with widening of the extra-axial spaces and ventricular dilatation. Electronically Signed   By: Virgle Grime M.D.   On: 04/06/2024 20:14    Microbiology: Results for orders placed or performed during the hospital encounter of 04/06/24  CSF culture w Gram Stain     Status: None   Collection Time: 04/10/24  3:39 PM   Specimen: CSF; Cerebrospinal Fluid  Result Value Ref Range Status   Specimen Description   Final    CSF Performed at [facility], [address], [place], [place] [address]    Special Requests   Final    NONE Performed at [facility], [address] [place], [place] [address]    Gram Stain   Final    NO ORGANISMS SEEN WBC SEEN RED BLOOD CELLS SEEN Performed at [facility], [address], [place], [place] [address]    Culture   Final    NO GROWTH 3 DAYS Performed at [facility], [address] [place], [place] [address]    Report Status 04/14/2024 FINAL  Final  Culture, Fungus without Smear     Status: None (Preliminary result)   Collection Time: 04/10/24  3:39 PM   Specimen: PATH Cytology CSF; Cerebrospinal Fluid  Result Value Ref Range Status   Specimen Description   Final    CSF Performed at [facility], [address] [place], [place] [address]    Special Requests   Final    NONE Performed at [facility], [address] [place], [place] [address]    Culture   Final    NO FUNGUS ISOLATED AFTER 5 DAYS Performed at [facility], [address], [place], [place] [address]    Report Status PENDING  Incomplete    Labs: CBC: Recent Labs  Lab 04/09/24 0508  04/10/24 0258 04/11/24 0515 04/12/24 0538 04/15/24 0916  WBC 8.3 8.2 7.7 15.9* 13.0*  NEUTROABS  --   --   --   --  11.2*  HGB 15.5 14.6 14.7 15.3 14.7  HCT 45.3 41.8 43.3 45.0 41.9  MCV 92.1 93.1 93.7 92.4 92.1  PLT 169 164 155 174 174   Basic Metabolic Panel: Recent Labs  Lab 04/09/24 0508 04/10/24 0258 04/11/24 0515 04/12/24 0538 04/15/24 0916  NA 138 138 139 137 140  K 3.8 3.6 3.8 4.1 3.9  CL  107 108 105 107 107  CO2 24 26 28  21* 24  GLUCOSE 119* 124* 102* 137* 139*  BUN 15 19 18 20  24*  CREATININE 0.92 1.04 1.04 1.00 0.94  CALCIUM  9.1 8.8* 9.0 9.2 8.9  MG 2.1 2.2  --   --   --   PHOS 2.9 4.0  --   --   --    Liver Function Tests: Recent Labs  Lab 04/10/24 1539 04/15/24 0916  AST  --  24  ALT  --  31  ALKPHOS  --  29*  BILITOT  --  0.6  PROT  --  6.9  ALBUMIN 4.0 3.7   CBG: No results for input(s): GLUCAP in the last 168 hours.  Discharge time spent: 32 minutes.  Signed: Elaine Roanhorse, DO [facility] 04/15/2024

## 2024-04-15 NOTE — Progress Notes (Signed)
 Mobility Specialist - Progress Note     04/15/24 1400  Mobility  Activity Stood at bedside;Transferred from bed to chair;Ambulated independently in hallway;Ambulated independently in room  Level of Assistance Modified independent, requires aide device or extra time  Assistive Device Wheelchair  Distance Ambulated (ft) 40 ft  Range of Motion/Exercises Active  Activity Response Tolerated well  Mobility Referral Yes  Mobility visit 1 Mobility  Mobility Specialist Stop Time (ACUTE ONLY) 1300   Pt standing beside bed upon entry on RA. Pt ambulates to wheelchair in hallway and wheeled around unit. Pt required constant repetitive cuing and manual hand hold to complete tasking. Pt returned to bed and left to sit EOB with RN and daughter present at bedside. MS observed further delayed responses today RN notified.   Jerri Morale Mobility Specialist 04/15/24, 2:31 PM

## 2024-04-15 NOTE — Progress Notes (Signed)
   04/15/24   To Whom it may concern,  HERO KULISH was hospitalized at [facility] from 04/06/2024 through 04/15/24  Capacity is not constant, and may change on a day-to-day basis depending on a variety of factors including the patient's health and sitaution.  At this time this patient indicates that he does NOT have capacity to make decisions. He is unable to understand the risks of proposed treatment, risks, benefits, alternative options, and does NOT demonstrate the ability to make a choice, or ability to communicate that choice. Capacity can be reassessed when/if this patient's mental status or clinical course changes.   Presently, we recommend deferring all decisions to his POA,  Should you have further questions please feel free to contact our office.  Be advised that no medical information will be divulged without a signed HIPAA release from the patient.    Sincerely, Jude Naclerio, DO [facility] Office  [phone]

## 2024-04-17 ENCOUNTER — Emergency Department
Admission: EM | Admit: 2024-04-17 | Discharge: 2024-04-17 | Discharge status: Against Medical Advice | Attending: Emergency Medicine | Admitting: Emergency Medicine

## 2024-04-17 ENCOUNTER — Other Ambulatory Visit: Payer: Self-pay

## 2024-04-17 ENCOUNTER — Emergency Department

## 2024-04-17 DIAGNOSIS — R072 Precordial pain: Secondary | ICD-10-CM | POA: Diagnosis present

## 2024-04-17 DIAGNOSIS — Z955 Presence of coronary angioplasty implant and graft: Secondary | ICD-10-CM | POA: Diagnosis not present

## 2024-04-17 DIAGNOSIS — Z5321 Procedure and treatment not carried out due to patient leaving prior to being seen by health care provider: Secondary | ICD-10-CM | POA: Diagnosis not present

## 2024-04-17 LAB — CBC
HCT: 44.5 % (ref 39.0–52.0)
Hemoglobin: 15.4 g/dL (ref 13.0–17.0)
MCH: 32.2 pg (ref 26.0–34.0)
MCHC: 34.6 g/dL (ref 30.0–36.0)
MCV: 92.9 fL (ref 80.0–100.0)
Platelets: 192 10*3/uL (ref 150–400)
RBC: 4.79 MIL/uL (ref 4.22–5.81)
RDW: 12.8 % (ref 11.5–15.5)
WBC: 15.3 10*3/uL — ABNORMAL HIGH (ref 4.0–10.5)
nRBC: 0 % (ref 0.0–0.2)

## 2024-04-17 LAB — OLIGOCLONAL BANDS, CSF + SERM

## 2024-04-17 LAB — BASIC METABOLIC PANEL WITH GFR
Anion gap: 9 (ref 5–15)
BUN: 34 mg/dL — ABNORMAL HIGH (ref 8–23)
CO2: 23 mmol/L (ref 22–32)
Calcium: 8.4 mg/dL — ABNORMAL LOW (ref 8.9–10.3)
Chloride: 108 mmol/L (ref 98–111)
Creatinine, Ser: 1 mg/dL (ref 0.61–1.24)
GFR, Estimated: >60 mL/min (ref >60)
Glucose, Bld: 105 mg/dL — ABNORMAL HIGH (ref 70–99)
Potassium: 3.8 mmol/L (ref 3.5–5.1)
Sodium: 140 mmol/L (ref 135–145)

## 2024-04-17 LAB — TROPONIN I (HIGH SENSITIVITY): Troponin I (High Sensitivity): 17 ng/L (ref <18)

## 2024-04-17 NOTE — ED Triage Notes (Signed)
 Pt come in via [facility] with complaints of chest paint that started a few days ago. Pt states that the pain comes and goes. Pt with no complaints of pain at this time.

## 2024-04-17 NOTE — ED Notes (Signed)
 First nurse note: Pt here via [facility] from home with substernal CP that radiates to R arm, HX of stents. HX of CVA per family  324 ASA given by EMS, CP resolved.   150/90 95% RA HR: 77  CBG 114

## 2024-04-17 NOTE — ED Notes (Signed)
 Pts family reports they are leaving.

## 2024-04-18 ENCOUNTER — Telehealth: Payer: Self-pay

## 2024-04-20 ENCOUNTER — Ambulatory Visit: Admitting: Family Medicine

## 2024-04-25 LAB — MISC LABCORP TEST (SEND OUT): Labcorp test code: 9985

## 2024-05-01 LAB — CULTURE, FUNGUS WITHOUT SMEAR

## 2024-05-09 ENCOUNTER — Ambulatory Visit: Admitting: Family Medicine

## 2024-05-10 ENCOUNTER — Encounter: Payer: Self-pay | Admitting: Family Medicine

## 2024-05-10 ENCOUNTER — Ambulatory Visit (INDEPENDENT_AMBULATORY_CARE_PROVIDER_SITE_OTHER): Admitting: Family Medicine

## 2024-05-10 VITALS — BP 115/78 | HR 74 | Temp 97.5°F | Resp 18 | Ht 70.0 in | Wt 190.0 lb

## 2024-05-10 DIAGNOSIS — F17200 Nicotine dependence, unspecified, uncomplicated: Secondary | ICD-10-CM

## 2024-05-10 DIAGNOSIS — F419 Anxiety disorder, unspecified: Secondary | ICD-10-CM | POA: Insufficient documentation

## 2024-05-10 DIAGNOSIS — F03B4 Unspecified dementia, moderate, with anxiety: Secondary | ICD-10-CM

## 2024-05-10 DIAGNOSIS — N39498 Other specified urinary incontinence: Secondary | ICD-10-CM | POA: Diagnosis not present

## 2024-05-10 DIAGNOSIS — R159 Full incontinence of feces: Secondary | ICD-10-CM | POA: Insufficient documentation

## 2024-05-10 DIAGNOSIS — R32 Unspecified urinary incontinence: Secondary | ICD-10-CM | POA: Insufficient documentation

## 2024-05-10 MED ORDER — LORAZEPAM 0.5 MG PO TABS: 0.5000 mg | ORAL_TABLET | Freq: Three times a day (TID) | ORAL | 0 refills | Status: AC | PRN

## 2024-05-10 NOTE — Assessment & Plan Note (Signed)
 He is in diapers all day and all night

## 2024-05-10 NOTE — Assessment & Plan Note (Signed)
 Discussed that neurologic medications may not work as effectively because he has dementia.  Will try Ativan  0.5 every 6 hours as needed agitation/anxiety.

## 2024-05-10 NOTE — Progress Notes (Signed)
 New Patient Office Visit  Subjective    Patient ID: Barry Sampson, male    DOB: 1956-06-08  Age: 68 y.o. MRN: [id number]  CC:  Chief Complaint  Patient presents with   Establish Care   Hospitalization Follow-up   Dementia    Possible medication    Anxiety   Nocturnal Enuresis    HPI Barry Sampson presents to establish care Discussed the use of AI scribe software for clinical note transcription with the patient, who gave verbal consent to proceed.  History of Present Illness   Barry Sampson is a 68 year old male with dementia who presents for a follow-up visit.  His daughter is his primary care giver. She charts hs fluid intake, counts his diapers and keeps up with his bowel movements.  He has experienced a rapid progression of dementia over the past few weeks, with a significant decline in cognitive abilities, evidenced by a SLUMS score of 1 out of 30. He struggles with spontaneous speech, often only responding with 'yes' or 'no', and requires assistance with daily activities such as eating, dressing, and personal hygiene. He does not independently initiate eating or drinking and needs prompting. He is unable to manage his medications independently and has been resistant to taking them, often spitting them out.    He experienced a fall at home shortly after being discharged from the hospital but did not sustain any injuries. His daughter has implemented safety measures at home, including bed rails and locks, to prevent further falls. He has not had any additional falls since these measures were put in place.  He has a history of substance use, with a positive drug screen for cocaine and marijuana during a recent hospital stay, although he does not recall using these substances. He has a long history of smoking but has quit since his hospitalization. He does not consume alcohol.  He experiences anxiety, particularly in noisy environments or when there are too many people  around. His daughter reports that he becomes anxious when her young son is active in the house. He is generally happy and has a stable mood, but his cognitive function declines in the evening, a phenomenon known as sundowning.  She is able to put him in bed and put the rails up on his bed and he stays in his bed until she gets him up in the morning.  He has a history of emphysema but is not currently on oxygen therapy. His oxygen levels dropped during his hospital stay but improved with albuterol treatment. He has a long history of smoking, which likely contributed to his emphysema.    He is not currently taking any medications due to difficulty with administration. He was previously prescribed lisinopril  20 mg, atorvastatin  40 mg, and aspirin  81 mg, but has been unable to consistently take them due to issues with swallowing and taste.  He has a history of congestive heart failure, with an echocardiogram showing an ejection fraction of 25-30%. He has not experienced any noticeable swelling in his feet, and his daughter is mindful of his salt intake to manage his condition.       Outpatient Encounter Medications as of 05/10/2024  Medication Sig   aspirin  EC 81 MG tablet Take 1 tablet (81 mg total) by mouth daily. Swallow whole.   atorvastatin  (LIPITOR) 40 MG tablet Take 1 tablet (40 mg total) by mouth daily.   cyanocobalamin  1000 MCG tablet Take 1 tablet (1,000 mcg total) by mouth daily.  lisinopril  (ZESTRIL ) 20 MG tablet Take 1 tablet (20 mg total) by mouth daily.   LORazepam  (ATIVAN ) 0.5 MG tablet Take 1 tablet (0.5 mg total) by mouth every 8 (eight) hours as needed for anxiety.   thiamine  (VITAMIN B-1) 100 MG tablet Take 1 tablet (100 mg total) by mouth daily.   No facility-administered encounter medications on file as of 05/10/2024.    Past Medical History:  Diagnosis Date   COPD (chronic obstructive pulmonary disease) (HCC)    Coronary artery disease     Past Surgical History:   Procedure Laterality Date   CORONARY STENT PLACEMENT      Family History  Problem Relation Age of Onset   Diabetes Mother    Diabetes Brother     Social History   Socioeconomic History   Marital status: Divorced    Spouse name: Not on file   Number of children: Not on file   Years of education: Not on file   Highest education level: Not on file  Occupational History   Not on file  Tobacco Use   Smoking status: Every Day    Current packs/day: 1.00    Types: Cigarettes    Passive exposure: Current   Smokeless tobacco: Never  Substance and Sexual Activity   Alcohol use: No   Drug use: Yes    Types: Marijuana    Comment: last smoked sunday   Sexual activity: Not on file  Other Topics Concern   Not on file  Social History Narrative   Not on file   Social Drivers of Health   Financial Resource Strain: Not on file  Food Insecurity: No Food Insecurity (04/07/2024)   Hunger Vital Sign    Worried About Running Out of Food in the Last Year: Never true    Ran Out of Food in the Last Year: Never true  Transportation Needs: No Transportation Needs (04/07/2024)   PRAPARE - Administrator, Civil Service (Medical): No    Lack of Transportation (Non-Medical): No  Physical Activity: Not on file  Stress: Not on file  Social Connections: Patient Declined (04/07/2024)   Social Connection and Isolation Panel    Frequency of Communication with Friends and Family: Patient declined    Frequency of Social Gatherings with Friends and Family: Patient declined    Attends Religious Services: Patient declined    Database administrator or Organizations: Patient declined    Attends Banker Meetings: Patient declined    Marital Status: Patient declined  Intimate Partner Violence: Not At Risk (04/07/2024)   Humiliation, Afraid, Rape, and Kick questionnaire    Fear of Current or Ex-Partner: No    Emotionally Abused: No    Physically Abused: No    Sexually Abused: No     ROS      Objective   BP 115/78 (BP Location: Left Arm, Patient Position: Sitting, Cuff Size: Normal)   Pulse 74   Temp (!) 97.5 F (36.4 C) (Oral)   Resp 18   Ht 5' 10 (1.778 m)   Wt 190 lb (86.2 kg)   SpO2 94%   BMI 27.26 kg/m    Physical Exam Vitals and nursing note reviewed.  Constitutional:      Appearance: Normal appearance.  HENT:     Head: Normocephalic and atraumatic.  Eyes:     Conjunctiva/sclera: Conjunctivae normal.  Cardiovascular:     Rate and Rhythm: Normal rate and regular rhythm.  Pulmonary:     Effort:  Pulmonary effort is normal.     Breath sounds: Normal breath sounds.  Genitourinary:    Pubic Area: No rash.      Penis: Normal.   Musculoskeletal:     Right lower leg: No edema.     Left lower leg: No edema.  Skin:    General: Skin is warm and dry.  Neurological:     Mental Status: He is alert and oriented to person, place, and time.  Psychiatric:        Mood and Affect: Mood normal.        Behavior: Behavior normal.        Thought Content: Thought content normal.        Judgment: Judgment normal.            The 10-year ASCVD risk score (Arnett DK, et al., 2019) is: 28.8%     Assessment & Plan:  Moderate dementia with anxiety, unspecified dementia type (HCC) -     LORazepam ; Take 1 tablet (0.5 mg total) by mouth every 8 (eight) hours as needed for anxiety.  Dispense: 30 tablet; Refill: 0  Anxiety Assessment & Plan: Discussed that neurologic medications may not work as effectively because he has dementia.  Will try Ativan  0.5 every 6 hours as needed agitation/anxiety.   Full incontinence of feces Assessment & Plan: He is in diapers all day and all night   Other urinary incontinence  Tobacco use disorder Assessment & Plan: Has forgotten that he smokes.  No smoking since he got out of the hospital.   Assessment and Plan    Dementia Advanced dementia with a SLUMS score of 1/30, indicating severe cognitive impairment.  No reversible causes identified; tests for syphilis and B12 deficiency were negative. Imaging (CT and MRI) showed cerebral atrophy. Symptoms consistent with Alzheimer's disease, including limited speech, dependency in daily activities, and occasional sundowning. Generally happy mood but experiences anxiety, especially in noisy environments. Rapid progression suggests severe stage. Ativan  0.5 mg every six hours as needed for anxiety was discussed, with risks of sedation and increased fall risk. Alternatives like Risperdal and Haldol were considered but associated with sudden death in dementia patients.  - Prescribe Ativan  0.5 mg every six hours as needed for anxiety. - Consult pharmacist about crushable medications. - Consider liquid formulations if pill administration is problematic.  Congestive Heart Failure Congestive heart failure with an ejection fraction of 25-30%. Risk of fluid retention; monitor for edema. Minimize salt intake to prevent fluid overload. - Advise on a low-sodium diet.  Emphysema Emphysema likely related to long-term smoking. Not on oxygen therapy; oxygen levels improved with albuterol during hospitalization. Smoking cessation achieved since hospitalization.  Substance Use Substance use history includes cocaine and marijuana, with recent positive tests during hospitalization. No substance use in years and smoking cessation since hospitalization.  General Health Maintenance Requires assistance with all activities of daily living. Routine schedule maintained to reduce anxiety. Caregiver attentive to needs, monitoring intake and output closely. - Continue routine daily care and monitoring. - Limit fluid intake after 5 PM to manage nocturnal enuresis. - Use barrier creams to protect skin integrity.  Goals of Care Caregiver pursuing guardianship and enrolling him in the PACE program for additional support and respite care. Focus on maintaining comfort and quality of life at  home. Caregiver aware of advanced dementia stage, focusing on keeping him happy and comfortable at home. - Continue guardianship process. - Proceed with PACE program enrollment for additional support.  Follow-up Regular follow-up necessary  to monitor condition and adjust care as needed. - Schedule follow-up appointments every three months or sooner if needed.        Return in about 3 months (around 08/10/2024).   Toben Acuna K Roarke Marciano, MD

## 2024-05-10 NOTE — Assessment & Plan Note (Signed)
 Has forgotten that he smokes.  No smoking since he got out of the hospital.

## 2024-05-18 ENCOUNTER — Telehealth: Payer: Self-pay | Admitting: Family Medicine

## 2024-05-18 NOTE — Telephone Encounter (Signed)
 Patient's daughter walked in and submitted a form from [facility] DSS / [place] /Special Needs to be completed.  Last OV 05/10/24. Copy placed in back office folder.

## 2024-05-23 ENCOUNTER — Telehealth: Payer: Self-pay | Admitting: Family Medicine

## 2024-05-23 DIAGNOSIS — Z0279 Encounter for issue of other medical certificate: Secondary | ICD-10-CM

## 2024-05-23 NOTE — Telephone Encounter (Signed)
 2:45 Called and  spoke with caregiver (daughter) informing her that the DSS Medicaid form is ready to be picked up.

## 2024-05-24 ENCOUNTER — Telehealth: Payer: Self-pay | Admitting: Family Medicine

## 2024-05-24 ENCOUNTER — Ambulatory Visit: Payer: Self-pay

## 2024-05-24 NOTE — Telephone Encounter (Signed)
 FYI Only or Action Required?: FYI only for provider.  Patient was last seen in primary care on 05/10/2024 by Ziglar, Susan K, MD.  Called Nurse Triage reporting Leg Pain.  Symptoms began a week ago.  Interventions attempted: Rest, hydration, or home remedies.  Symptoms are: unchanged.  Triage Disposition: See PCP When Office is Open (Within 3 Days)  Patient/caregiver understands and will follow disposition?: Yes  Copied from CRM #[id number]. Topic: Clinical - Red Word Triage >> May 24, 2024 12:52 PM Avram MATSU wrote: Red Word that prompted transfer to Nurse Triage: right leg pain   ----------------------------------------------------------------------- From previous Reason for Contact - Scheduling: Patient/patient representative is calling to schedule an appointment. Refer to attachments for appointment information. Reason for Disposition  [1] MODERATE pain (e.g., interferes with normal activities, limping) AND [2] present > 3 days  Answer Assessment - Initial Assessment Questions 1. ONSET: When did the pain start?      One week ago 2. LOCATION: Where is the pain located?      right 3. PAIN: How bad is the pain?    (Scale 1-10; or mild, moderate, severe)     Moderate-he is rubbing the leg. This morning he did have shooting pain X 3 bouts but that has resolved.  4. WORK OR EXERCISE: Has there been any recent work or exercise that involved this part of the body?      Slept with a box in pocket  5. CAUSE: What do you think is causing the leg pain?     Sleeping position and on an object 6. OTHER SYMPTOMS: Do you have any other symptoms? (e.g., chest pain, back pain, breathing difficulty, swelling, rash, fever, numbness, weakness)     Denies bruising, redness, swelling.   Additional info: Patient has cognitive issues and not a reliable reporter, daughter states he is really verbalizing pain but he is rubbing the leg. Does not appear to have other symptoms. Same day  appointment offered today-declined, offered acute OV 05/25/24 daughter Santana declined and requested appointment next week advised sooner evaluation, daughter Santana accepts acute visit on 05/26/24.  Protocols used: Leg Pain-A-AH

## 2024-05-24 NOTE — Telephone Encounter (Signed)
 05/24/24 @ 11:21 Patient's son walked in and picked up DSS/Medicaid  form.

## 2024-05-24 NOTE — Telephone Encounter (Signed)
 FYI only.

## 2024-05-26 ENCOUNTER — Encounter: Payer: Self-pay | Admitting: Family Medicine

## 2024-05-26 ENCOUNTER — Ambulatory Visit
Admission: RE | Admit: 2024-05-26 | Discharge: 2024-05-26 | Disposition: A | Discharge status: Home / Self Care | Source: Ambulatory Visit | Attending: Family Medicine | Admitting: Family Medicine

## 2024-05-26 ENCOUNTER — Ambulatory Visit: Admitting: Family Medicine

## 2024-05-26 ENCOUNTER — Ambulatory Visit
Admission: RE | Admit: 2024-05-26 | Discharge: 2024-05-26 | Disposition: A | Discharge status: Home / Self Care | Attending: Family Medicine | Admitting: Family Medicine

## 2024-05-26 VITALS — BP 130/81 | HR 76 | Resp 16 | Ht 70.0 in | Wt 191.0 lb

## 2024-05-26 DIAGNOSIS — M79651 Pain in right thigh: Secondary | ICD-10-CM

## 2024-05-26 MED ORDER — GABAPENTIN 100 MG PO CAPS: 100.0000 mg | ORAL_CAPSULE | Freq: Three times a day (TID) | ORAL | 3 refills | Status: AC

## 2024-05-26 NOTE — Progress Notes (Signed)
   Established Patient Office Visit  Subjective   Patient ID: Barry Sampson, male    DOB: 1956-04-16  Age: 68 y.o. MRN: [id number]  Chief Complaint  Patient presents with   Leg Pain    Right leg pain x few days     Leg Pain    68 yo gentleman diagnosed with metabolic encephalopathy following a brain insult.  He has had a remarkable recovery.  Able to talk in complete sentences and describe when his leg started hurting and how it feels.  He does have a follow-up with neurology. His right thigh area started hurting 3 to 4 days ago he describes it as a burning sensation he denies any lumbar pain.  The pain is intermittent patient is uncertain what brings the pain on.  Seated in the exam room he denies any pain.  He denies rash.    Objective:     BP 130/81   Pulse 76   Resp 16   Ht 5' 10 (1.778 m)   Wt 191 lb (86.6 kg)   BMI 27.41 kg/m    Physical Exam Vitals and nursing note reviewed.  Constitutional:      Appearance: Normal appearance.  HENT:     Head: Normocephalic and atraumatic.  Eyes:     Conjunctiva/sclera: Conjunctivae normal.  Cardiovascular:     Rate and Rhythm: Normal rate and regular rhythm.  Pulmonary:     Effort: Pulmonary effort is normal.     Breath sounds: Normal breath sounds.  Musculoskeletal:     Right lower leg: No edema.     Left lower leg: No edema.  Skin:    General: Skin is warm and dry.  Neurological:     Mental Status: He is alert and oriented to person, place, and time.     Comments: Negative leg lift to 45 degrees bilaterally.  External and internal rotation of right hip is normal without pain  Psychiatric:        Mood and Affect: Mood normal.        Behavior: Behavior normal.        Thought Content: Thought content normal.        Judgment: Judgment normal.          No results found for any visits on 05/26/24.    The 10-year ASCVD risk score (Arnett DK, et al., 2019) is: 34.6%    Assessment & Plan:  Pain of right  thigh -     DG Lumbar Spine 2-3 Views; Future -     Gabapentin; Take 1 capsule (100 mg total) by mouth 3 (three) times daily.  Dispense: 90 capsule; Refill: 3  Pain in right thigh Assessment & Plan: Ask him to get films of his lumbar spine so we can rule out lytic or blastic lesions.  The description of this pain sounds like sciatica, lower extremity radiculopathy.  Tested negative with leg lift but patient is very stiff and full leg lift could not be obtained. Trial of gabapentin 100 mg 3 times daily.  Advised that it may make him sleepy or dizzy. Follow-up in 3 weeks.      Return in about 3 weeks (around 06/16/2024).    Xayla Puzio K Kerry Odonohue, MD

## 2024-05-26 NOTE — Assessment & Plan Note (Addendum)
 Ask him to get films of his lumbar spine so we can rule out lytic or blastic lesions.  The description of this pain sounds like sciatica, lower extremity radiculopathy.  Tested negative with leg lift but patient is very stiff and full leg lift could not be obtained. Trial of gabapentin 100 mg 3 times daily.  Advised that it may make him sleepy or dizzy. Follow-up in 3 weeks.

## 2024-06-05 ENCOUNTER — Ambulatory Visit: Payer: Self-pay | Admitting: Family Medicine

## 2024-06-20 ENCOUNTER — Other Ambulatory Visit: Payer: Self-pay | Admitting: Family Medicine

## 2024-06-20 DIAGNOSIS — M5416 Radiculopathy, lumbar region: Secondary | ICD-10-CM

## 2024-06-22 ENCOUNTER — Telehealth: Payer: Self-pay | Admitting: Family Medicine

## 2024-06-22 ENCOUNTER — Ambulatory Visit: Admitting: Family Medicine

## 2024-06-22 NOTE — Telephone Encounter (Signed)
 Wife stated pt is seeing other doctors who are seeing him for his overall care. He may not ever return for care at [facility]

## 2024-07-20 ENCOUNTER — Ambulatory Visit: Admission: RE | Admit: 2024-07-20 | Source: Ambulatory Visit

## 2024-08-10 ENCOUNTER — Ambulatory Visit: Admitting: Family Medicine

## 2024-08-16 ENCOUNTER — Ambulatory Visit
Admission: RE | Admit: 2024-08-16 | Discharge: 2024-08-16 | Disposition: A | Discharge status: Home / Self Care | Source: Ambulatory Visit | Attending: Family Medicine | Admitting: Family Medicine

## 2024-08-16 DIAGNOSIS — M5416 Radiculopathy, lumbar region: Secondary | ICD-10-CM | POA: Diagnosis present
